# Patient Record
Sex: Female | Born: 1960 | Race: White | Hispanic: No | Smoking: Never smoker
Health system: Southern US, Community
[De-identification: ages and names within clinical notes are randomized; demographics above are authoritative.]

## PROBLEM LIST (undated history)

## (undated) DIAGNOSIS — E785 Hyperlipidemia, unspecified: Secondary | ICD-10-CM

## (undated) DIAGNOSIS — E1169 Type 2 diabetes mellitus with other specified complication: Secondary | ICD-10-CM

## (undated) DIAGNOSIS — I10 Essential (primary) hypertension: Secondary | ICD-10-CM

## (undated) DIAGNOSIS — G4733 Obstructive sleep apnea (adult) (pediatric): Secondary | ICD-10-CM

## (undated) DIAGNOSIS — F329 Major depressive disorder, single episode, unspecified: Secondary | ICD-10-CM

## (undated) DIAGNOSIS — I2510 Atherosclerotic heart disease of native coronary artery without angina pectoris: Secondary | ICD-10-CM

## (undated) DIAGNOSIS — F32A Depression, unspecified: Secondary | ICD-10-CM

## (undated) DIAGNOSIS — E669 Obesity, unspecified: Secondary | ICD-10-CM

## (undated) HISTORY — PX: PERCUTANEOUS CORONARY STENT INTERVENTION (PCI-S): SHX6016

---

## 2003-12-29 ENCOUNTER — Other Ambulatory Visit: Payer: Self-pay

## 2004-08-02 ENCOUNTER — Emergency Department: Payer: Self-pay | Admitting: Emergency Medicine

## 2004-08-05 ENCOUNTER — Emergency Department: Payer: Self-pay | Admitting: Emergency Medicine

## 2004-08-07 ENCOUNTER — Emergency Department: Payer: Self-pay | Admitting: Emergency Medicine

## 2005-02-24 ENCOUNTER — Ambulatory Visit: Payer: Self-pay | Admitting: Family Medicine

## 2005-04-28 ENCOUNTER — Ambulatory Visit: Payer: Self-pay | Admitting: Orthopaedic Surgery

## 2005-12-28 ENCOUNTER — Observation Stay: Payer: Self-pay | Admitting: Internal Medicine

## 2005-12-28 ENCOUNTER — Other Ambulatory Visit: Payer: Self-pay

## 2006-02-21 ENCOUNTER — Emergency Department: Payer: Self-pay | Admitting: Unknown Physician Specialty

## 2006-04-26 ENCOUNTER — Ambulatory Visit: Payer: Self-pay | Admitting: Gastroenterology

## 2006-05-02 ENCOUNTER — Ambulatory Visit: Payer: Self-pay | Admitting: Gastroenterology

## 2006-05-30 ENCOUNTER — Ambulatory Visit: Payer: Self-pay | Admitting: Gastroenterology

## 2006-08-05 ENCOUNTER — Emergency Department: Payer: Self-pay | Admitting: Emergency Medicine

## 2006-08-15 ENCOUNTER — Emergency Department: Payer: Self-pay | Admitting: Emergency Medicine

## 2006-08-15 ENCOUNTER — Other Ambulatory Visit: Payer: Self-pay

## 2006-08-30 ENCOUNTER — Ambulatory Visit: Payer: Self-pay | Admitting: Family Medicine

## 2006-09-20 ENCOUNTER — Encounter: Payer: Self-pay | Admitting: Family Medicine

## 2006-09-30 ENCOUNTER — Encounter: Payer: Self-pay | Admitting: Family Medicine

## 2006-10-31 ENCOUNTER — Encounter: Payer: Self-pay | Admitting: Family Medicine

## 2006-11-30 ENCOUNTER — Encounter: Payer: Self-pay | Admitting: Family Medicine

## 2007-09-12 ENCOUNTER — Other Ambulatory Visit: Payer: Self-pay

## 2007-09-13 ENCOUNTER — Ambulatory Visit: Payer: Self-pay | Admitting: Gastroenterology

## 2007-09-13 ENCOUNTER — Emergency Department: Payer: Self-pay | Admitting: Emergency Medicine

## 2007-12-18 ENCOUNTER — Ambulatory Visit: Payer: Self-pay | Admitting: Family Medicine

## 2007-12-31 ENCOUNTER — Ambulatory Visit: Payer: Self-pay | Admitting: Internal Medicine

## 2008-01-01 ENCOUNTER — Ambulatory Visit: Payer: Self-pay | Admitting: Unknown Physician Specialty

## 2008-01-04 ENCOUNTER — Ambulatory Visit: Payer: Self-pay | Admitting: Surgery

## 2008-01-11 ENCOUNTER — Ambulatory Visit: Payer: Self-pay | Admitting: Internal Medicine

## 2008-01-17 ENCOUNTER — Ambulatory Visit: Payer: Self-pay | Admitting: Surgery

## 2008-01-30 ENCOUNTER — Ambulatory Visit: Payer: Self-pay | Admitting: Internal Medicine

## 2009-10-01 ENCOUNTER — Emergency Department: Payer: Self-pay | Admitting: Internal Medicine

## 2009-11-09 ENCOUNTER — Observation Stay: Payer: Self-pay | Admitting: Student

## 2010-04-28 DIAGNOSIS — F32A Depression, unspecified: Secondary | ICD-10-CM | POA: Insufficient documentation

## 2010-04-28 DIAGNOSIS — F329 Major depressive disorder, single episode, unspecified: Secondary | ICD-10-CM | POA: Insufficient documentation

## 2011-01-17 DIAGNOSIS — E669 Obesity, unspecified: Secondary | ICD-10-CM | POA: Insufficient documentation

## 2011-01-17 DIAGNOSIS — I2510 Atherosclerotic heart disease of native coronary artery without angina pectoris: Secondary | ICD-10-CM | POA: Insufficient documentation

## 2011-04-07 IMAGING — US US CAROTID DUPLEX BILAT
1 series · 17 of 24 positions shown · non-contrast
Comparison: none

REASON FOR EXAM: right hand weakness, left hand parasthesias
COMMENTS:

[Series 1: us carotid duplex bilat · 17 of 53 slices shown]
[im 1/53]
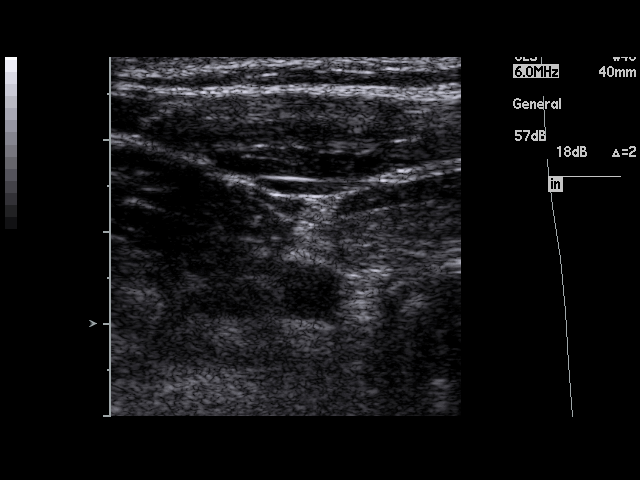
[im 5/53]
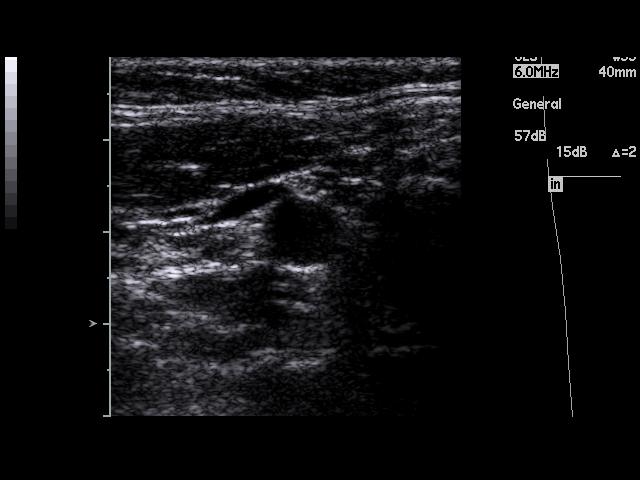
[im 7/53]
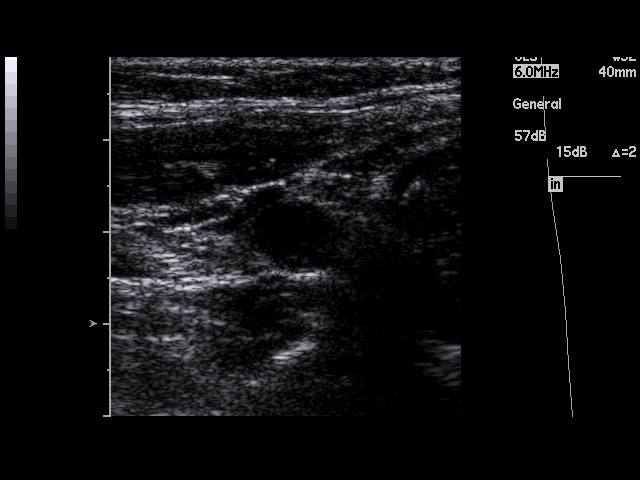
[im 10/53]
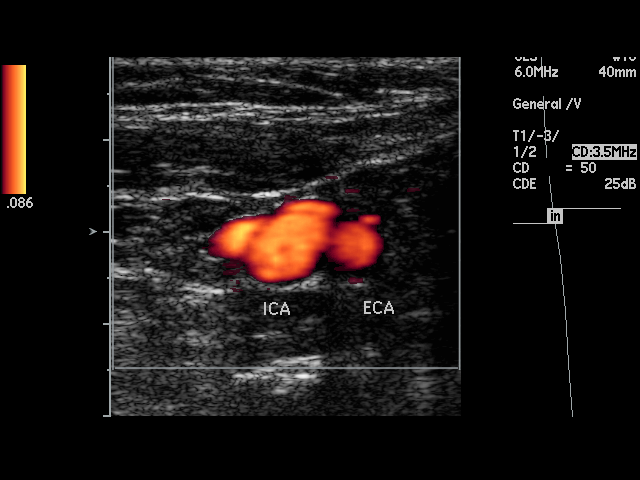
[im 14/53]
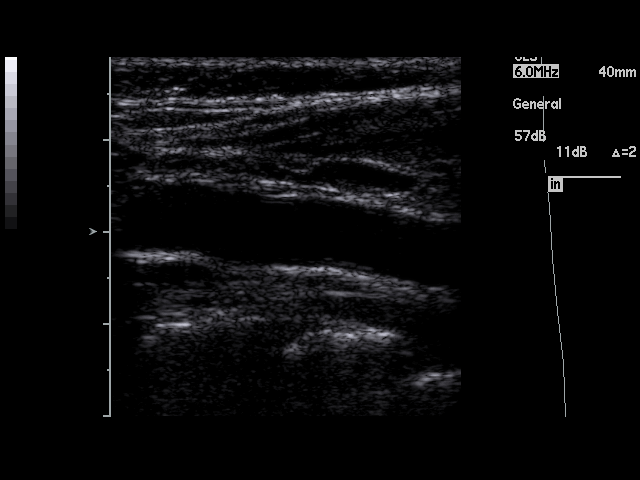
[im 16/53]
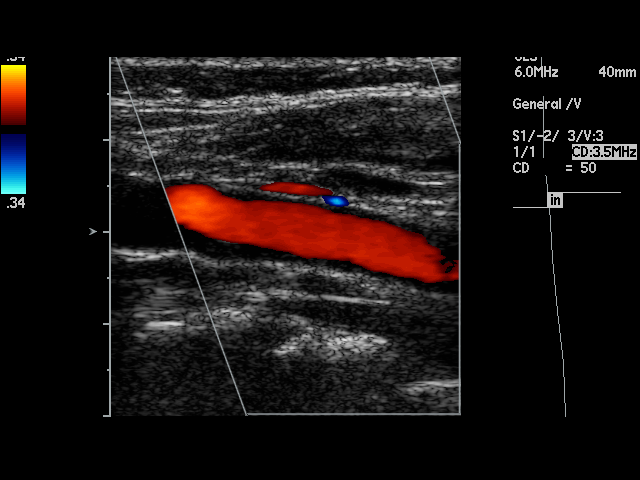
[im 21/53]
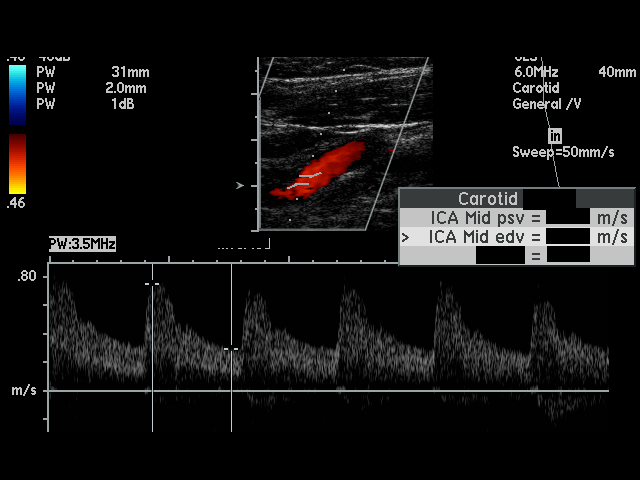
[im 23/53]
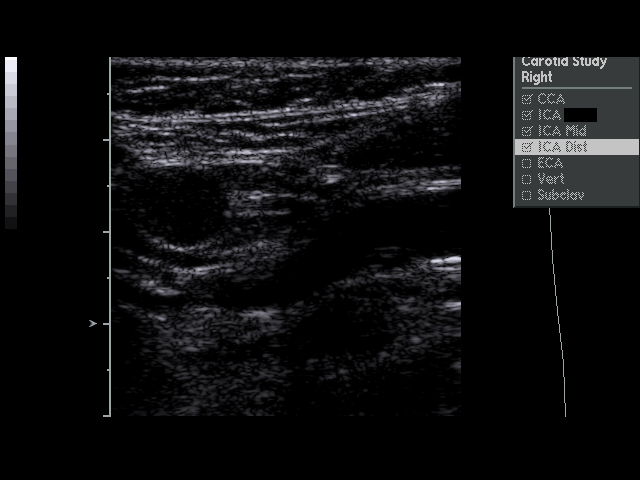
[im 28/53]
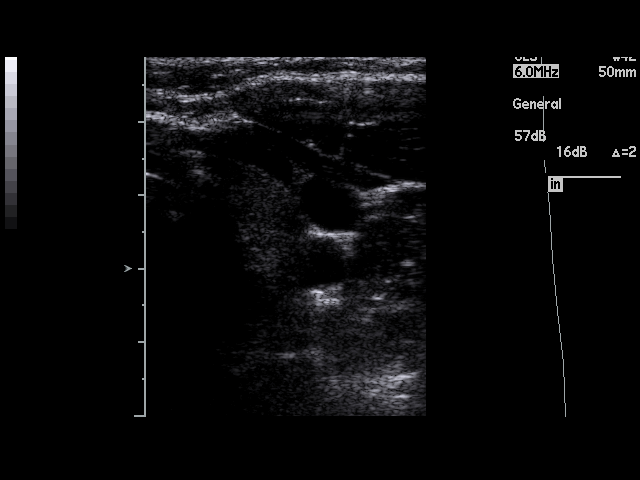
[im 30/53]
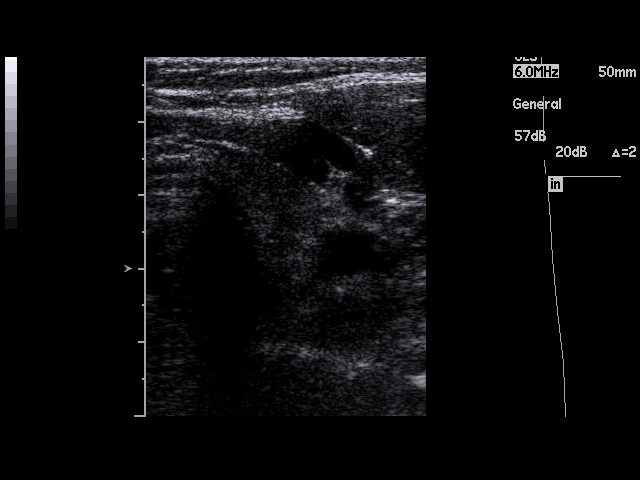
[im 32/53]
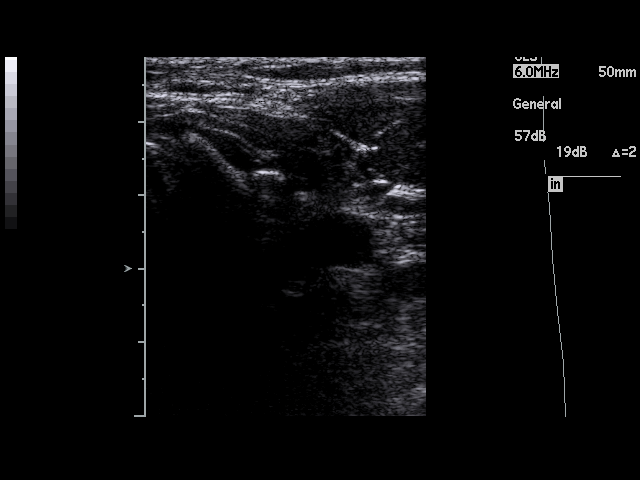
[im 37/53]
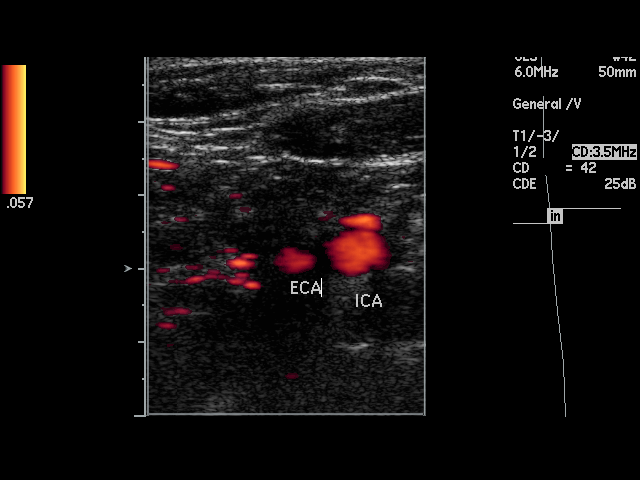
[im 39/53]
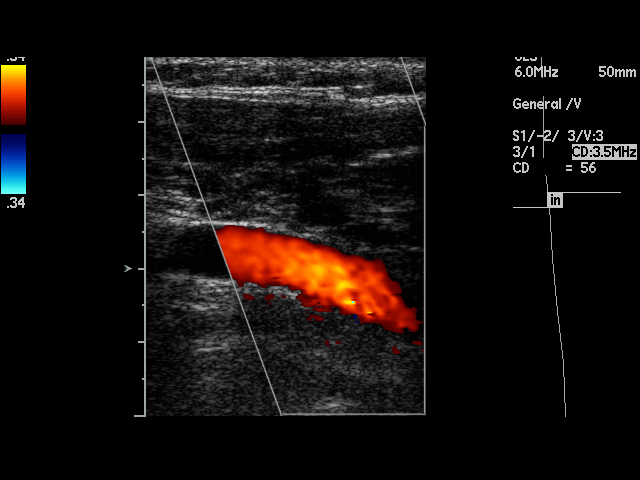
[im 43/53]
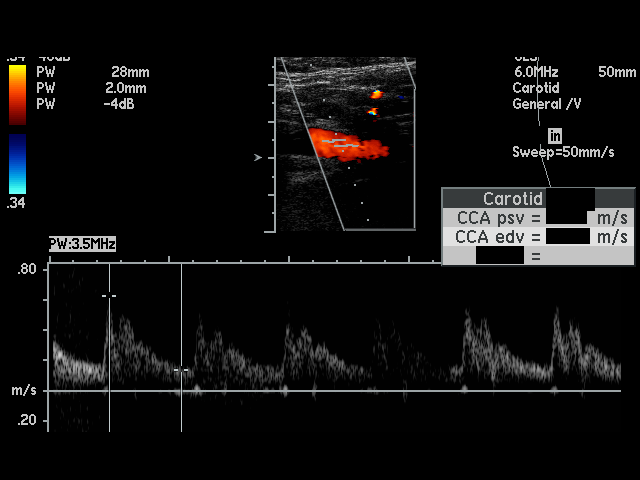
[im 46/53]
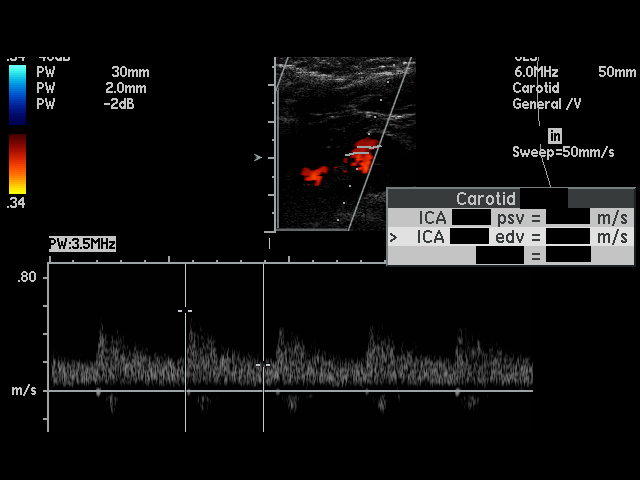
[im 48/53]
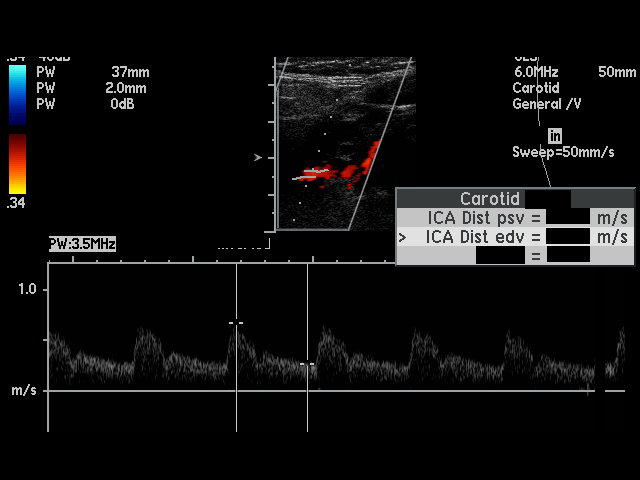
[im 53/53]
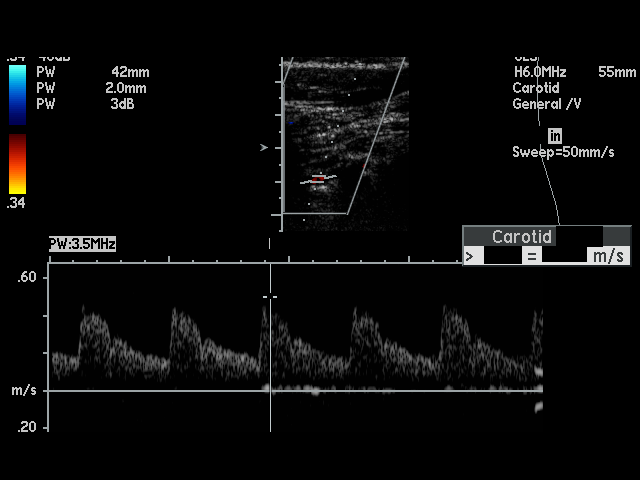

[17 of 24 positions shown; findings below may reference images not displayed]

PROCEDURE:     US  - US CAROTID DOPPLER BILATERAL  - November 10, 2009  [DATE]

RESULT:

Grayscale, Duplex, color flow and SPECTRAL waveform imaging was performed of
the right and left carotid systems.

Visual evaluation of the right and left carotid systems demonstrates minimal
intimal thickening asymmetric involving the internal carotid artery and
carotid bulb on the right as well as the internal carotid artery on the left.

SPECTRAL waveform and color flow imaging within the right and left carotid
systems is unremarkable. Antegrade flow is identified within the right and
left vertebral arteries. ICA/CCA ratios:

Right:
Left:
IMPRESSION: No sonographic evidence of hemodynamically significant
stenosis within the right or left carotid system.

## 2011-11-10 DIAGNOSIS — E119 Type 2 diabetes mellitus without complications: Secondary | ICD-10-CM | POA: Insufficient documentation

## 2012-12-25 ENCOUNTER — Emergency Department: Payer: Self-pay | Admitting: Emergency Medicine

## 2012-12-25 LAB — BASIC METABOLIC PANEL
Anion Gap: 5 — ABNORMAL LOW (ref 7–16)
BUN: 17 mg/dL (ref 7–18)
Creatinine: 1.23 mg/dL (ref 0.60–1.30)
EGFR (African American): 58 — ABNORMAL LOW
EGFR (Non-African Amer.): 50 — ABNORMAL LOW
Glucose: 65 mg/dL (ref 65–99)
Osmolality: 279 (ref 275–301)
Potassium: 4 mmol/L (ref 3.5–5.1)
Sodium: 140 mmol/L (ref 136–145)

## 2012-12-25 LAB — URINALYSIS, COMPLETE
Bilirubin,UR: NEGATIVE
Blood: NEGATIVE
Glucose,UR: NEGATIVE mg/dL (ref 0–75)
Nitrite: NEGATIVE
Ph: 5 (ref 4.5–8.0)
RBC,UR: 26 /HPF (ref 0–5)
Specific Gravity: 1.019 (ref 1.003–1.030)
Squamous Epithelial: 4
WBC UR: 666 /HPF (ref 0–5)

## 2012-12-25 LAB — CBC
HCT: 37.7 % (ref 35.0–47.0)
MCV: 85 fL (ref 80–100)
Platelet: 308 10*3/uL (ref 150–440)
RBC: 4.46 10*6/uL (ref 3.80–5.20)
RDW: 13.3 % (ref 11.5–14.5)

## 2012-12-27 LAB — URINE CULTURE

## 2013-01-24 ENCOUNTER — Inpatient Hospital Stay: Payer: Self-pay | Admitting: Internal Medicine

## 2013-01-24 LAB — COMPREHENSIVE METABOLIC PANEL
Albumin: 3.4 g/dL (ref 3.4–5.0)
Alkaline Phosphatase: 83 U/L (ref 50–136)
Anion Gap: 7 (ref 7–16)
BUN: 10 mg/dL (ref 7–18)
Calcium, Total: 9.1 mg/dL (ref 8.5–10.1)
Chloride: 100 mmol/L (ref 98–107)
Co2: 29 mmol/L (ref 21–32)
Creatinine: 1.25 mg/dL (ref 0.60–1.30)
EGFR (African American): 56 — ABNORMAL LOW
EGFR (Non-African Amer.): 49 — ABNORMAL LOW
Glucose: 120 mg/dL — ABNORMAL HIGH (ref 65–99)
Osmolality: 272 (ref 275–301)
Potassium: 3.5 mmol/L (ref 3.5–5.1)
SGOT(AST): 13 U/L — ABNORMAL LOW (ref 15–37)
Sodium: 136 mmol/L (ref 136–145)
Total Protein: 7.8 g/dL (ref 6.4–8.2)

## 2013-01-24 LAB — URINALYSIS, COMPLETE
Ketone: NEGATIVE
Leukocyte Esterase: NEGATIVE
Nitrite: NEGATIVE
Ph: 5 (ref 4.5–8.0)
RBC,UR: 1 /HPF (ref 0–5)
Specific Gravity: 1.018 (ref 1.003–1.030)

## 2013-01-24 LAB — CBC
MCH: 28.4 pg (ref 26.0–34.0)
MCV: 84 fL (ref 80–100)
Platelet: 245 10*3/uL (ref 150–440)
RBC: 4.41 10*6/uL (ref 3.80–5.20)
WBC: 21.1 10*3/uL — ABNORMAL HIGH (ref 3.6–11.0)

## 2013-01-25 LAB — BASIC METABOLIC PANEL
Anion Gap: 4 — ABNORMAL LOW (ref 7–16)
BUN: 13 mg/dL (ref 7–18)
Calcium, Total: 8.1 mg/dL — ABNORMAL LOW (ref 8.5–10.1)
Glucose: 146 mg/dL — ABNORMAL HIGH (ref 65–99)
Osmolality: 280 (ref 275–301)
Potassium: 3.9 mmol/L (ref 3.5–5.1)
Sodium: 139 mmol/L (ref 136–145)

## 2013-01-25 LAB — CBC WITH DIFFERENTIAL/PLATELET
Basophil %: 0.3 %
Lymphocyte %: 15.5 %
MCHC: 33.8 g/dL (ref 32.0–36.0)
MCV: 84 fL (ref 80–100)
Monocyte #: 2.1 x10 3/mm — ABNORMAL HIGH (ref 0.2–0.9)
Monocyte %: 11.9 %
Neutrophil #: 12.9 10*3/uL — ABNORMAL HIGH (ref 1.4–6.5)
RBC: 4.05 10*6/uL (ref 3.80–5.20)
RDW: 13.4 % (ref 11.5–14.5)
WBC: 18.2 10*3/uL — ABNORMAL HIGH (ref 3.6–11.0)

## 2013-01-30 LAB — CULTURE, BLOOD (SINGLE)

## 2013-05-10 DIAGNOSIS — I4719 Other supraventricular tachycardia: Secondary | ICD-10-CM | POA: Insufficient documentation

## 2013-05-10 DIAGNOSIS — I471 Supraventricular tachycardia: Secondary | ICD-10-CM | POA: Insufficient documentation

## 2014-01-03 DIAGNOSIS — M1710 Unilateral primary osteoarthritis, unspecified knee: Secondary | ICD-10-CM | POA: Insufficient documentation

## 2014-01-03 DIAGNOSIS — M179 Osteoarthritis of knee, unspecified: Secondary | ICD-10-CM | POA: Insufficient documentation

## 2014-06-13 DIAGNOSIS — M720 Palmar fascial fibromatosis [Dupuytren]: Secondary | ICD-10-CM | POA: Insufficient documentation

## 2014-06-21 IMAGING — CR DG CHEST 1V PORT
1 series · 1 of 1 positions shown · non-contrast
Comparison: none

REASON FOR EXAM: sepsis
COMMENTS:

[ap]
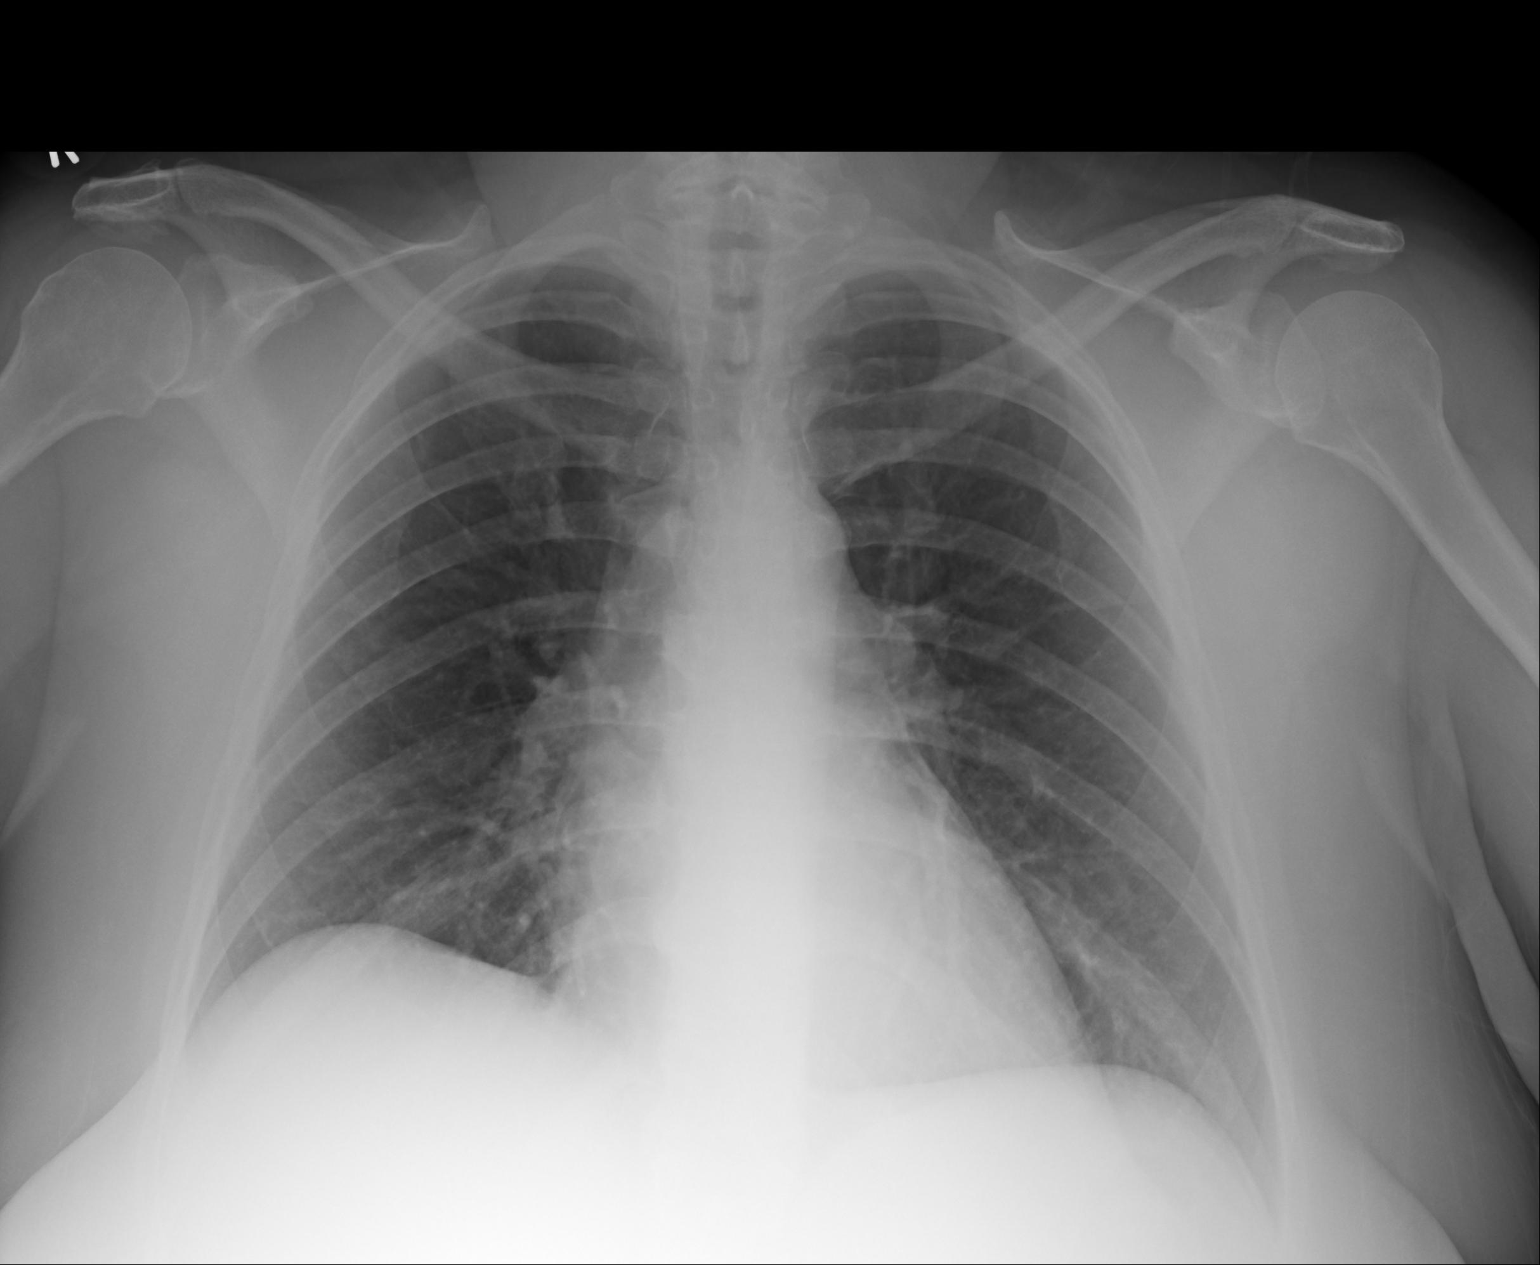

[1 of 1 positions shown; findings below may reference images not displayed]

PROCEDURE:     DXR - DXR PORTABLE CHEST SINGLE VIEW  - January 24, 2013  [DATE]

RESULT:     Comparison is made to the study of 11/09/2009.

The lungs are clear. The heart and pulmonary vessels are normal. The bony
and mediastinal structures are unremarkable. There is no effusion. There is
no pneumothorax or evidence of congestive failure.
IMPRESSION: No acute cardiopulmonary disease.

[REDACTED]

## 2014-11-21 NOTE — H&P (Signed)
PATIENT NAME:  [name], [name] MR#:  [id number] DATE OF BIRTH:  [birth year]  DATE OF ADMISSION:  01/24/2013  PRIMARY CARE PHYSICIAN: At [facility].   CHIEF COMPLAINT: Abdominal wall redness, pain.   HISTORY OF PRESENTING ILLNESS: This 54 year old female patient with history of hypertension, diabetes, coronary artery disease presents to the Emergency Room complaining of 2 days of redness and pain on her left side of the abdominal wall. Here, the patient has been found to have cellulitis. Aspiration was attempted of the site as there was a small blister but nothing could be aspirated. No fluctuance. The patient is febrile at 100.2, tachycardic into the 110s, elevated white count of 20,000 and is being admitted to the hospitalist service for sepsis with abdominal wall cellulitis. The patient has been on vancomycin in the Emergency Room.   She does not have any nausea, vomiting, dysuria, shortness of breath. After the patient received a dose of vancomycin, the patient has been a little restless. Family members mention she has been more sleepy today, poor appetite. Multiple family members, including kids, in the house have MRSA infections recurrently.   PAST MEDICAL HISTORY:  1. TIA.   2. Hypertension.  3. Diabetes mellitus, type 2.  4. Hyperlipidemia.  5. Anxiety.  6. Knee surgery bilaterally.  7. Depression.  8. Mitral valve prolapse.  9. Obesity.  10. Obstructive sleep apnea, on CPAP at night.  11. Migraines.  12. Coronary artery disease with stent placement.   SOCIAL HISTORY: Does not smoke. No alcohol. No illicit drugs. She is married with children and grandkids.    FAMILY HISTORY: Multiple family members with heart disease, diabetes, hypertension. Mother apparently had lung cancer.   REVIEW OF SYSTEMS:  CONSTITUTIONAL: Has fever. No weight loss, weight gain.  EYES: No blurred vision, pain, redness.  ENT: No tinnitus, ear pain, hearing loss.  RESPIRATORY: No shortness of breath,  cough, hemoptysis.  CARDIOVASCULAR: No chest pain, PND, orthopnea, edema,  GASTROINTESTINAL: No nausea, vomiting, diarrhea.  GENITOURINARY: No dysuria, hematuria.  MUSCULOSKELETAL: No joint pain, back pain or arthritis.  SKIN: Has the abdominal wall cellulitis.  PSYCHIATRIC: No anxiety or depression.  NEUROLOGIC: No focal numbness, weakness.   HOME MEDICATIONS: Incomplete list available at this time and doses unavailable on a few of the medications, but the patient at home is on aspirin, atorvastatin, Effient, gabapentin, glyburide, Humulin N, lisinopril, metformin, metoprolol, multivitamin, Zofran, paroxetine, trazodone and Wellbutrin.   PHYSICAL EXAMINATION:  VITAL SIGNS: Temperature 100.8, pulse of 118, respirations 18, blood pressure 109/60, saturating 97% on 1 liter oxygen.  GENERAL: Obese Caucasian female patient lying in bed, restless.  PSYCHIATRIC: Alert, awake. Mood and affect appropriate. Judgment intact.  HEENT: Atraumatic, normocephalic. Oral mucosa moist and pink. External ears and nose normal. No pallor. No icterus. Pupils bilaterally equal and reactive to light.  NECK: Supple. No thyromegaly. No palpable lymph nodes. Trachea midline. No carotid bruit, JVD.  CARDIOVASCULAR: S1, S2, tachycardic without any murmurs. No edema in the lower extremities.  RESPIRATORY: Normal work of breathing. Clear to auscultation on both sides. GASTROINTESTINAL: Soft abdomen, nontender. Bowel sounds present. No hepatosplenomegaly palpable.  SKIN: Warm and dry. Has the abdominal wall cellulitis area in the left lateral in the skin fold measuring about 5 x 5 cm. A small blister where aspiration was attempted. No fluctuance.  MUSCULOSKELETAL: No joint swelling, redness, effusion of the large joints. Normal muscle tone.  NEUROLOGICAL: Moves all 4 extremities equally on both sides.  LYMPHATIC: No cervical, supraclavicular or inguinal  lymphadenopathy.  GENITOURINARY: No CVA tenderness or bladder  distention.   LAB STUDIES: Show blood glucose 781, BUN 10, creatinine 1.25, sodium 136, potassium 3.5. Albumin 3.4. AST, ALT, alkaline phosphatase, bilirubin normal. WBC 21.1, hemoglobin 12.5, platelets of 245. Urinalysis shows trace bacteria but only 2 WBCs.   ASSESSMENT AND PLAN:  1. Abdominal wall cellulitis with sepsis. The patient has tachycardia, elevated white count and fever of 100.8. Will start the patient on intravenous vancomycin and intravenous fluids. Send for blood cultures. Skin has been marked to follow on the progress of cellulitis. No fluctuance. No abscess. Aspiration was attempted in the ER and nothing could be aspirated. Multiple family members with history of methicillin-resistant Staphylococcus aureus. The patient is on vancomycin.  2. Sepsis secondary to cellulitis.  3. Diabetes mellitus. Continue home medications. Secondary to poor appetite, will drop her insulin dosage to half. Can up titrate depending on the blood sugars.  4. Hypertension. Continue home medications. Dosages not available on a few of the medications. Have to review medications again when the complete list is available.  5. Coronary artery disease, stable.  6. Sleep apnea. Will continue CPAP in the hospital at night.  7. Deep vein thrombosis prophylaxis with Lovenox.   CODE STATUS: FULL CODE.   Time spent today on this case was 45 minutes.   ____________________________ Leia Alf Nolan Lasser, MD srs:gb D: 01/24/2013 22:37:31 ET T: 01/24/2013 22:49:03 ET JOB#: [id number]  cc: Alveta Heimlich R. Alexiz Cothran, MD, <Dictator> [place] MD ELECTRONICALLY SIGNED 01/25/2013 15:52

## 2014-11-21 NOTE — Discharge Summary (Signed)
PATIENT NAMEBOBBYJO, [name] MR#:  [id number] DATE OF BIRTH:  1961-05-04  DATE OF ADMISSION:  01/24/2013 DATE OF DISCHARGE:  01/25/2013  PRIMARY CARE PHYSICIAN:  None local.  CONDITION: Stable.   CODE STATUS: Full code.   HOME MEDICATIONS: Please refer to the [facility] physician discharge instruction medication reconciliation list. Continue all home medications.  We will add clindamycin 300 mg capsule 2 caps every 6 hours for 7 days.   DIET: Low sodium, low fat, low cholesterol ADA diet.   ACTIVITY: As tolerated.   FOLLOW UP CARE: Follow with PCP within one week. Follow up CBC.   DISCHARGE DIAGNOSES: 1. Abdominal wall cellulitis with sepsis, leukocytosis.  2. Hypertension.  3. Diabetes.  4. Obesity.   REASON FOR ADMISSION: Abdominal wall redness and pain.   HOSPITAL COURSE: The patient is a 54 year old Caucasian female with a history of hypertension, diabetes, coronary artery disease, presented to the ED with 2 days of abdominal wall redness and pain. The patient was found to have cellulitis and was treated with vancomycin. Patient's white count was at 20,000 in ED.  For detailed history and physical examination, please refer to the admission note dictated by Dr. Darvin Neighbours.  After admission, the patient has been treated with vancomycin IVPB, the patient's WBC decreased to 18.2 today. The patient's symptoms have much improved. She has no abdominal wall pain, erythema or tenderness. She is clinically stable and will be discharged to home today. I discussed the patient's discharge plan with the patient and the patient's mother. The patient was advised to follow up with PCP and get CBC and that we will start clindamycin for 7 days.   TIME SPENT: About 35 minutes.   ____________________________ Demetrios Loll, MD qc:rw D: 01/25/2013 16:44:18 ET T: 01/25/2013 18:42:02 ET JOB#: [id number]  cc: Demetrios Loll, MD, <Dictator> Demetrios Loll MD ELECTRONICALLY SIGNED 01/25/2013 23:38

## 2014-12-12 DIAGNOSIS — G4733 Obstructive sleep apnea (adult) (pediatric): Secondary | ICD-10-CM | POA: Insufficient documentation

## 2015-04-07 ENCOUNTER — Other Ambulatory Visit: Payer: Self-pay | Admitting: Student in an Organized Health Care Education/Training Program

## 2015-05-10 ENCOUNTER — Other Ambulatory Visit: Payer: Self-pay | Admitting: Student in an Organized Health Care Education/Training Program

## 2015-05-21 ENCOUNTER — Observation Stay
Admission: EM | Admit: 2015-05-21 | Discharge: 2015-05-22 | Disposition: A | Discharge status: Home / Self Care | Payer: Medicare HMO | Attending: Internal Medicine | Admitting: Internal Medicine

## 2015-05-21 ENCOUNTER — Encounter: Payer: Self-pay | Admitting: Emergency Medicine

## 2015-05-21 ENCOUNTER — Emergency Department: Payer: Medicare HMO

## 2015-05-21 DIAGNOSIS — Z7984 Long term (current) use of oral hypoglycemic drugs: Secondary | ICD-10-CM | POA: Diagnosis not present

## 2015-05-21 DIAGNOSIS — I471 Supraventricular tachycardia: Secondary | ICD-10-CM | POA: Diagnosis not present

## 2015-05-21 DIAGNOSIS — F419 Anxiety disorder, unspecified: Secondary | ICD-10-CM | POA: Diagnosis not present

## 2015-05-21 DIAGNOSIS — E119 Type 2 diabetes mellitus without complications: Secondary | ICD-10-CM | POA: Diagnosis not present

## 2015-05-21 DIAGNOSIS — Z794 Long term (current) use of insulin: Secondary | ICD-10-CM | POA: Insufficient documentation

## 2015-05-21 DIAGNOSIS — F329 Major depressive disorder, single episode, unspecified: Secondary | ICD-10-CM | POA: Diagnosis not present

## 2015-05-21 DIAGNOSIS — I2510 Atherosclerotic heart disease of native coronary artery without angina pectoris: Secondary | ICD-10-CM | POA: Insufficient documentation

## 2015-05-21 DIAGNOSIS — R110 Nausea: Secondary | ICD-10-CM | POA: Insufficient documentation

## 2015-05-21 DIAGNOSIS — E669 Obesity, unspecified: Secondary | ICD-10-CM | POA: Diagnosis not present

## 2015-05-21 DIAGNOSIS — Z79899 Other long term (current) drug therapy: Secondary | ICD-10-CM | POA: Diagnosis not present

## 2015-05-21 DIAGNOSIS — Z955 Presence of coronary angioplasty implant and graft: Secondary | ICD-10-CM | POA: Insufficient documentation

## 2015-05-21 DIAGNOSIS — G4733 Obstructive sleep apnea (adult) (pediatric): Secondary | ICD-10-CM | POA: Insufficient documentation

## 2015-05-21 DIAGNOSIS — R079 Chest pain, unspecified: Secondary | ICD-10-CM | POA: Diagnosis not present

## 2015-05-21 DIAGNOSIS — E785 Hyperlipidemia, unspecified: Secondary | ICD-10-CM | POA: Insufficient documentation

## 2015-05-21 DIAGNOSIS — Z6836 Body mass index (BMI) 36.0-36.9, adult: Secondary | ICD-10-CM | POA: Diagnosis not present

## 2015-05-21 DIAGNOSIS — I10 Essential (primary) hypertension: Secondary | ICD-10-CM | POA: Insufficient documentation

## 2015-05-21 DIAGNOSIS — Z9049 Acquired absence of other specified parts of digestive tract: Secondary | ICD-10-CM | POA: Insufficient documentation

## 2015-05-21 HISTORY — DX: Major depressive disorder, single episode, unspecified: F32.9

## 2015-05-21 HISTORY — DX: Type 2 diabetes mellitus with other specified complication: E11.69

## 2015-05-21 HISTORY — DX: Obesity, unspecified: E66.9

## 2015-05-21 HISTORY — DX: Depression, unspecified: F32.A

## 2015-05-21 HISTORY — DX: Essential (primary) hypertension: I10

## 2015-05-21 HISTORY — DX: Atherosclerotic heart disease of native coronary artery without angina pectoris: I25.10

## 2015-05-21 HISTORY — DX: Hyperlipidemia, unspecified: E78.5

## 2015-05-21 HISTORY — DX: Obstructive sleep apnea (adult) (pediatric): G47.33

## 2015-05-21 LAB — TROPONIN I

## 2015-05-21 LAB — CBC
HCT: 39.1 % (ref 35.0–47.0)
HEMOGLOBIN: 12.8 g/dL (ref 12.0–16.0)
MCH: 28.6 pg (ref 26.0–34.0)
MCHC: 32.9 g/dL (ref 32.0–36.0)
MCV: 86.9 fL (ref 80.0–100.0)
PLATELETS: 291 10*3/uL (ref 150–440)
RBC: 4.5 MIL/uL (ref 3.80–5.20)
RDW: 12.9 % (ref 11.5–14.5)
WBC: 12.4 10*3/uL — ABNORMAL HIGH (ref 3.6–11.0)

## 2015-05-21 LAB — COMPREHENSIVE METABOLIC PANEL
ALT: 25 U/L (ref 14–54)
AST: 23 U/L (ref 15–41)
Albumin: 3.7 g/dL (ref 3.5–5.0)
Alkaline Phosphatase: 62 U/L (ref 38–126)
Anion gap: 7 (ref 5–15)
BUN: 20 mg/dL (ref 6–20)
CHLORIDE: 102 mmol/L (ref 101–111)
CO2: 29 mmol/L (ref 22–32)
CREATININE: 1.52 mg/dL — AB (ref 0.44–1.00)
Calcium: 9.3 mg/dL (ref 8.9–10.3)
GFR calc non Af Amer: 37 mL/min — ABNORMAL LOW (ref >60)
GFR, EST AFRICAN AMERICAN: 43 mL/min — AB (ref >60)
Glucose, Bld: 100 mg/dL — ABNORMAL HIGH (ref 65–99)
POTASSIUM: 4.2 mmol/L (ref 3.5–5.1)
SODIUM: 138 mmol/L (ref 135–145)
Total Bilirubin: 0.3 mg/dL (ref 0.3–1.2)
Total Protein: 7.2 g/dL (ref 6.5–8.1)

## 2015-05-21 NOTE — ED Provider Notes (Signed)
[facility] Emergency Department Provider Note  Time seen: 11:27 PM  I have reviewed the triage vital signs and the nursing notes.   HISTORY  Chief Complaint Chest Pain    HPI [name] is a 54 y.o. female with a past medical history of hypertension, hyperlipidemia, diabetes, CAD status post stent, and second angioplasty, presents the emergency department for chest pain. According to the patient at 8 PM tonight she developed acute mid to left chest pain. Describes as a dull aching pressure 8/10 in severity radiating to her back and up to her shoulder. Associated with flushing/diaphoresis, and nausea. Took 2 nitroglycerin and aspirin with improvement from an 8/10 to a 5/10. Continues with chest pain in the emergency department. Patient states the last time she needed nitroglycerin was 5 months ago, and she only has ever needed 1 tablet.     History reviewed. No pertinent past medical history.  There are no active problems to display for this patient.   History reviewed. No pertinent past surgical history.  No current outpatient prescriptions on file.  Allergies Review of patient's allergies indicates no known allergies.  History reviewed. No pertinent family history.  Social History Social History  Substance Use Topics  . Smoking status: Never Smoker   . Smokeless tobacco: None  . Alcohol Use: None    Review of Systems Constitutional: Negative for fever. Cardiovascular: Positive for chest pain Respiratory: Negative for shortness of breath. Gastrointestinal: Negative for abdominal pain. Positive for nausea Genitourinary: Negative for dysuria. Musculoskeletal: Pain radiates to the back 10-point ROS otherwise negative.  ____________________________________________   PHYSICAL EXAM:  VITAL SIGNS: ED Triage Vitals  Enc Vitals Group     BP 05/21/15 2220 116/92 mmHg     Pulse Rate 05/21/15 2222 69     Resp 05/21/15 2220 18     Temp  05/21/15 2222 98.4 F (36.9 C)     Temp Source 05/21/15 2222 Oral     SpO2 05/21/15 2222 98 %     Weight 05/21/15 2220 215 lb (97.523 kg)     Height 05/21/15 2220 5\' 5"  (1.651 m)     Head Cir --      Peak Flow --      Pain Score 05/21/15 2221 7     Pain Loc --      Pain Edu? --      Excl. in [place]? --     Constitutional: Alert and oriented. Well appearing and in no distress. Eyes: Normal exam ENT   Head: Normocephalic and atraumatic   Mouth/Throat: Mucous membranes are moist. Cardiovascular: Normal rate, regular rhythm. No murmur. Equal pulses in bilateral upper extremities. Respiratory: Normal respiratory effort without tachypnea nor retractions. Breath sounds are clear and equal bilaterally. No wheezes/rales/rhonchi. Gastrointestinal: Soft and nontender. No distention.  Musculoskeletal: Nontender with normal range of motion in all extremities. No lower extremity tenderness or edema. Neurologic:  Normal speech and language. No gross focal neurologic deficits  Skin:  Skin is warm, dry and intact.  Psychiatric: Mood and affect are normal. ____________________________________________    EKG  EKG reviewed and interpreted by myself shows sinus rhythm at 69 bpm, narrow QRS, normal axis, normal intervals, nonspecific but no concerning ST changes noted.  ____________________________________________    RADIOLOGY  Chest x-ray shows no acute abnormalities  ____________________________________________    INITIAL IMPRESSION / ASSESSMENT AND PLAN / ED COURSE  Pertinent labs & imaging results that were available during my care of the patient were reviewed  by me and considered in my medical decision making (see chart for details).  Labs are within normal limits, however given the patient's history, risk factors, and concerning symptoms we will admit the patient for further workup and evaluation.  ____________________________________________   FINAL CLINICAL IMPRESSION(S) /  ED DIAGNOSES  Chest pain   Harvest Dark, MD 05/21/15 2330

## 2015-05-21 NOTE — ED Notes (Signed)
MD at bedside. 

## 2015-05-21 NOTE — ED Notes (Signed)
Pt arrives via EMS from home with c/o Midsternal/left chest pain that is pressure and 7/10.  Pain radiates through to back.  Pt with some nausea.  Pt rec'd 3 SL NTG with no relief and 324 mg ASA.

## 2015-05-22 ENCOUNTER — Encounter: Payer: Self-pay | Admitting: Internal Medicine

## 2015-05-22 DIAGNOSIS — R079 Chest pain, unspecified: Secondary | ICD-10-CM | POA: Diagnosis present

## 2015-05-22 DIAGNOSIS — R0789 Other chest pain: Secondary | ICD-10-CM | POA: Diagnosis not present

## 2015-05-22 LAB — GLUCOSE, CAPILLARY
Glucose-Capillary: 115 mg/dL — ABNORMAL HIGH (ref 65–99)
Glucose-Capillary: 153 mg/dL — ABNORMAL HIGH (ref 65–99)

## 2015-05-22 LAB — TSH: TSH: 5.597 u[IU]/mL — ABNORMAL HIGH (ref 0.350–4.500)

## 2015-05-22 LAB — HEMOGLOBIN A1C: Hgb A1c MFr Bld: 7.3 % — ABNORMAL HIGH (ref 4.0–6.0)

## 2015-05-22 LAB — TROPONIN I
Troponin I: <0.03 ng/mL (ref <0.031)
Troponin I: <0.03 ng/mL (ref <0.031)

## 2015-05-22 MED ORDER — SODIUM CHLORIDE 0.9 % IV SOLN
INTRAVENOUS | Status: DC
  Administered 2015-05-22: 01:00:00 via INTRAVENOUS

## 2015-05-22 MED ORDER — INFLUENZA VAC SPLIT QUAD 0.5 ML IM SUSY: 0.5000 mL | PREFILLED_SYRINGE | INTRAMUSCULAR | Status: DC

## 2015-05-22 MED ORDER — GABAPENTIN 300 MG PO CAPS
900.0000 mg | ORAL_CAPSULE | Freq: Every day | ORAL | Status: DC
  Administered 2015-05-22: 900 mg via ORAL
  Filled 2015-05-22: qty 3

## 2015-05-22 MED ORDER — ONDANSETRON HCL 4 MG/2ML IJ SOLN
4.0000 mg | Freq: Once | INTRAMUSCULAR | Status: AC
  Administered 2015-05-22: 4 mg via INTRAVENOUS
  Filled 2015-05-22: qty 2

## 2015-05-22 MED ORDER — INSULIN DETEMIR 100 UNIT/ML ~~LOC~~ SOLN
20.0000 [IU] | Freq: Every day | SUBCUTANEOUS | Status: DC
  Filled 2015-05-22 (×2): qty 0.2

## 2015-05-22 MED ORDER — FUROSEMIDE 20 MG PO TABS
20.0000 mg | ORAL_TABLET | Freq: Every day | ORAL | Status: DC
  Filled 2015-05-22: qty 1

## 2015-05-22 MED ORDER — METOPROLOL TARTRATE 25 MG PO TABS
25.0000 mg | ORAL_TABLET | Freq: Every morning | ORAL | Status: DC
  Filled 2015-05-22: qty 1

## 2015-05-22 MED ORDER — HYDROXYZINE HCL 25 MG PO TABS: 25.0000 mg | ORAL_TABLET | ORAL | Status: DC | PRN

## 2015-05-22 MED ORDER — ISOSORBIDE MONONITRATE ER 30 MG PO TB24
30.0000 mg | ORAL_TABLET | Freq: Every day | ORAL | Status: DC
  Administered 2015-05-22: 30 mg via ORAL
  Filled 2015-05-22: qty 1

## 2015-05-22 MED ORDER — PRASUGREL HCL 10 MG PO TABS
10.0000 mg | ORAL_TABLET | Freq: Every day | ORAL | Status: DC
  Administered 2015-05-22: 10 mg via ORAL
  Filled 2015-05-22: qty 1

## 2015-05-22 MED ORDER — ACETAMINOPHEN 650 MG RE SUPP: 650.0000 mg | Freq: Four times a day (QID) | RECTAL | Status: DC | PRN

## 2015-05-22 MED ORDER — ACETAMINOPHEN 325 MG PO TABS: 650.0000 mg | ORAL_TABLET | Freq: Four times a day (QID) | ORAL | Status: DC | PRN

## 2015-05-22 MED ORDER — TRAZODONE HCL 50 MG PO TABS
75.0000 mg | ORAL_TABLET | Freq: Every day | ORAL | Status: DC
  Administered 2015-05-22: 75 mg via ORAL
  Filled 2015-05-22: qty 2

## 2015-05-22 MED ORDER — BUPROPION HCL 75 MG PO TABS
75.0000 mg | ORAL_TABLET | Freq: Two times a day (BID) | ORAL | Status: DC
  Administered 2015-05-22 (×2): 75 mg via ORAL
  Filled 2015-05-22 (×3): qty 1

## 2015-05-22 MED ORDER — SODIUM CHLORIDE 0.9 % IJ SOLN
3.0000 mL | Freq: Two times a day (BID) | INTRAMUSCULAR | Status: DC
  Administered 2015-05-22: 3 mL via INTRAVENOUS

## 2015-05-22 MED ORDER — MORPHINE SULFATE (PF) 2 MG/ML IV SOLN: 2.0000 mg | INTRAVENOUS | Status: DC | PRN

## 2015-05-22 MED ORDER — INSULIN ASPART 100 UNIT/ML ~~LOC~~ SOLN: 0.0000 [IU] | Freq: Three times a day (TID) | SUBCUTANEOUS | Status: DC

## 2015-05-22 MED ORDER — HEPARIN SODIUM (PORCINE) 5000 UNIT/ML IJ SOLN
5000.0000 [IU] | Freq: Three times a day (TID) | INTRAMUSCULAR | Status: DC
  Administered 2015-05-22 (×2): 5000 [IU] via SUBCUTANEOUS
  Filled 2015-05-22 (×2): qty 1

## 2015-05-22 MED ORDER — CITALOPRAM HYDROBROMIDE 20 MG PO TABS
40.0000 mg | ORAL_TABLET | Freq: Every day | ORAL | Status: DC
  Administered 2015-05-22: 40 mg via ORAL
  Filled 2015-05-22: qty 2

## 2015-05-22 MED ORDER — DOCUSATE SODIUM 100 MG PO CAPS
100.0000 mg | ORAL_CAPSULE | Freq: Two times a day (BID) | ORAL | Status: DC
  Administered 2015-05-22 (×2): 100 mg via ORAL
  Filled 2015-05-22 (×2): qty 1

## 2015-05-22 MED ORDER — ONDANSETRON HCL 4 MG PO TABS: 4.0000 mg | ORAL_TABLET | Freq: Four times a day (QID) | ORAL | Status: DC | PRN

## 2015-05-22 MED ORDER — ONDANSETRON HCL 4 MG/2ML IJ SOLN: 4.0000 mg | Freq: Four times a day (QID) | INTRAMUSCULAR | Status: DC | PRN

## 2015-05-22 MED ORDER — ATORVASTATIN CALCIUM 20 MG PO TABS
80.0000 mg | ORAL_TABLET | Freq: Every day | ORAL | Status: DC
  Administered 2015-05-22: 80 mg via ORAL
  Filled 2015-05-22: qty 4

## 2015-05-22 MED ORDER — LISINOPRIL 5 MG PO TABS
2.5000 mg | ORAL_TABLET | Freq: Every day | ORAL | Status: DC
  Administered 2015-05-22: 2.5 mg via ORAL
  Filled 2015-05-22: qty 1

## 2015-05-22 MED ORDER — METOPROLOL TARTRATE 25 MG PO TABS
12.5000 mg | ORAL_TABLET | Freq: Every evening | ORAL | Status: DC
  Administered 2015-05-22: 12.5 mg via ORAL
  Filled 2015-05-22: qty 0.5

## 2015-05-22 MED ORDER — MORPHINE SULFATE (PF) 4 MG/ML IV SOLN
4.0000 mg | Freq: Once | INTRAVENOUS | Status: AC
  Administered 2015-05-22: 4 mg via INTRAVENOUS
  Filled 2015-05-22: qty 1

## 2015-05-22 NOTE — Consult Note (Signed)
CARDIOLOGY CONSULT NOTE  Patient ID: [name] MRN: [id number] DOB/AGE: [birth year] 54 y.o.  Admit date: 05/21/2015  Primary Cardiologist Dr. Loraine Leriche at [facility] Reason for Consultation chest pain.  HPI: This is a 54 year old female with known history of coronary artery disease followed at [facility]. She has known history of stable angina with documented coronary artery disease status post drug-eluting stent placement to the LAD in 2012 and angioplasty to the first diagonal in 2014, type 2 diabetes, obstructive sleep apnea, hyperlipidemia, paroxysmal atrial tachycardia, depression and anxiety. Most recent nuclear stress test September 2015 was normal. She presented with chest pain that happened yesterday in the afternoon. It was described as aching and pressure feeling which lasted for a few hours. He did not respond to nitroglycerin. She is currently chest pain-free. Her troponin has been negative and ECG did not show any acute changes.  Review of systems complete and found to be negative unless listed above   Past Medical History  Diagnosis Date  . Coronary artery disease   . Hypertension   . Diabetes mellitus type 2 in obese ([place])   . OSA (obstructive sleep apnea)   . Hyperlipidemia   . Depression     Family History  Problem Relation Age of Onset  . Coronary artery disease Father   . Diabetes Mellitus II Mother   . Lung cancer Mother   . Cancer - Other Father     Mouth    Social History   Social History  . Marital Status: Widowed    Spouse Name: N/A  . Number of Children: N/A  . Years of Education: N/A   Occupational History  . Not on file.   Social History Main Topics  . Smoking status: Never Smoker   . Smokeless tobacco: Not on file  . Alcohol Use: Not on file  . Drug Use: Not on file  . Sexual Activity: Not on file   Other Topics Concern  . Not on file   Social History Narrative    Past Surgical History  Procedure Laterality Date  . Percutaneous coronary  stent intervention (pci-s)      x2     Prescriptions prior to admission  Medication Sig Dispense Refill Last Dose  . atorvastatin (LIPITOR) 80 MG tablet Take 80 mg by mouth at bedtime.   05/20/2015 at Unknown time  . buPROPion (WELLBUTRIN) 75 MG tablet Take 75 mg by mouth 2 (two) times daily.   05/21/2015 at Unknown time  . citalopram (CELEXA) 40 MG tablet Take 1 tablet by mouth daily.   05/21/2015 at Unknown time  . furosemide (LASIX) 20 MG tablet Take 1 tablet by mouth daily.   05/21/2015 at Unknown time  . gabapentin (NEURONTIN) 300 MG capsule Take 3 capsules by mouth at bedtime.   05/20/2015 at Unknown time  . glipiZIDE (GLUCOTROL) 10 MG tablet Take 1 tablet by mouth 2 (two) times daily.   05/21/2015 at Unknown time  . hydrOXYzine (ATARAX/VISTARIL) 25 MG tablet Take 25 mg by mouth every 4 (four) hours as needed for anxiety.   prn at prn  . insulin NPH Human (HUMULIN N,NOVOLIN N) 100 UNIT/ML injection Inject 34-38 Units into the skin 2 (two) times daily before a meal. 38 units in the morning and 34 units in the evening   05/21/2015 at Unknown time  . isosorbide mononitrate (IMDUR) 30 MG 24 hr tablet Take 30 mg by mouth at bedtime.   05/20/2015 at Unknown time  . lisinopril (PRINIVIL,ZESTRIL) 5 MG  tablet Take 2.5 mg by mouth daily.   05/21/2015 at 1000  . metFORMIN (GLUCOPHAGE) 500 MG tablet Take 1,000 mg by mouth 2 (two) times daily with a meal.   05/21/2015 at Unknown time  . metoprolol tartrate (LOPRESSOR) 25 MG tablet Take 12.5-25 mg by mouth 2 (two) times daily. 25 mg in the morning and 12.5 mg at bedtime   05/21/2015 at 1000  . prasugrel (EFFIENT) 10 MG TABS tablet Take 10 mg by mouth daily.   05/21/2015 at Unknown time  . traZODone (DESYREL) 50 MG tablet Take 75 mg by mouth at bedtime.   05/20/2015 at Unknown time    Physical Exam: Blood pressure 100/47, pulse 65, temperature 97.7 F (36.5 C), temperature source Oral, resp. rate 16, height 5\' 5"  (1.651 m), weight 218 lb 9.6 oz  (99.156 kg), SpO2 92 %.   Constitutional: She is oriented to person, place, and time. She appears well-developed and well-nourished. No distress.  HENT: No nasal discharge.  Head: Normocephalic and atraumatic.  Eyes: Pupils are equal and round. No discharge.  Neck: Normal range of motion. Neck supple. No JVD present. No thyromegaly present.  Cardiovascular: Normal rate, regular rhythm, normal heart sounds. Exam reveals no gallop and no friction rub. No murmur heard.  Pulmonary/Chest: Effort normal and breath sounds normal. No stridor. No respiratory distress. She has no wheezes. She has no rales. She exhibits no tenderness.  Abdominal: Soft. Bowel sounds are normal. She exhibits no distension. There is no tenderness. There is no rebound and no guarding.  Musculoskeletal: Normal range of motion. She exhibits no edema and no tenderness.  Neurological: She is alert and oriented to person, place, and time. Coordination normal.  Skin: Skin is warm and dry. No rash noted. She is not diaphoretic. No erythema. No pallor.  Psychiatric: She has a normal mood and affect. Her behavior is normal. Judgment and thought content normal.     Labs:   Lab Results  Component Value Date   WBC 12.4* 05/21/2015   HGB 12.8 05/21/2015   HCT 39.1 05/21/2015   MCV 86.9 05/21/2015   PLT 291 05/21/2015    Recent Labs Lab 05/21/15 2220  NA 138  K 4.2  CL 102  CO2 29  BUN 20  CREATININE 1.52*  CALCIUM 9.3  PROT 7.2  BILITOT 0.3  ALKPHOS 62  ALT 25  AST 23  GLUCOSE 100*   Lab Results  Component Value Date   TROPONINI <0.03 05/22/2015       EKG:  NSR with low voltage   ASSESSMENT AND PLAN:   1. Atypical chest pain: The chest pain is overall atypical in quality which happened at rest and did not respond to nitroglycerin. There was no EKG changes and troponin is negative. The patient is known to have coronary artery disease as outlined above. She had a stress test done in September 2015 which was  normal. I suggested proceeding with a repeat stress test. However, the patient prefers to follow-up with her outpatient cardiologist which I think is reasonable given that basic workup so far has been negative. Can ambulate the patient today and if she has no recurrent symptoms, she can be discharged home from a cardiac standpoint. She has a follow-up appointment with her cardiologist next week.  2. Coronary artery disease: Patient is on optimal medical therapy.  Signed: Kathlyn Sacramento MD, [facility] 05/22/2015, 9:28 AM

## 2015-05-22 NOTE — H&P (Signed)
[name] is an 54 y.o. female.   Chief Complaint: Chest pain HPI: The patient presents to the emergency department complaining of severe chest pain unrelieved by 2 nitroglycerin tablets. The patient's pain began at rest. It was substernal and radiated through to her back and up into her left neck. She denies any nausea, vomiting or shortness of breath associated with her pain. In the emergency department the patient received aspirin which relieved her pain some. She required morphine as well to reduce her pain. At this time she is pain free. Total duration of pain more than 20 minutes. Patient has a past history of coronary artery disease status post PCI and angioplasty 2. Due to her recurrent symptoms emergency department staff called for admission.  Past Medical History  Diagnosis Date  . Coronary artery disease   . Hypertension   . Diabetes mellitus type 2 in obese ([place])   . OSA (obstructive sleep apnea)   . Hyperlipidemia   . Depression     Past Surgical History  Procedure Laterality Date  . Percutaneous coronary stent intervention (pci-s)      x2    History reviewed. No pertinent family history. Social History:  reports that she has never smoked. She does not have any smokeless tobacco history on file. Her alcohol and drug histories are not on file.  Allergies: No Known Allergies  Prior to Admission medications   Medication Sig Start Date End Date Taking? Authorizing Provider  atorvastatin (LIPITOR) 80 MG tablet Take 80 mg by mouth at bedtime.   Yes Historical Provider, MD  buPROPion (WELLBUTRIN) 75 MG tablet Take 75 mg by mouth 2 (two) times daily.   Yes Historical Provider, MD  citalopram (CELEXA) 40 MG tablet Take 1 tablet by mouth daily. 05/03/15  Yes Historical Provider, MD  furosemide (LASIX) 20 MG tablet Take 1 tablet by mouth daily. 04/10/15  Yes Historical Provider, MD  gabapentin (NEURONTIN) 300 MG capsule Take 3 capsules by mouth at bedtime. 04/15/15  Yes Historical  Provider, MD  glipiZIDE (GLUCOTROL) 10 MG tablet Take 1 tablet by mouth 2 (two) times daily. 04/30/15  Yes Historical Provider, MD  hydrOXYzine (ATARAX/VISTARIL) 25 MG tablet Take 25 mg by mouth every 4 (four) hours as needed for anxiety.   Yes Historical Provider, MD  insulin NPH Human (HUMULIN N,NOVOLIN N) 100 UNIT/ML injection Inject 34-38 Units into the skin 2 (two) times daily before a meal. 38 units in the morning and 34 units in the evening   Yes Historical Provider, MD  isosorbide mononitrate (IMDUR) 30 MG 24 hr tablet Take 30 mg by mouth at bedtime.   Yes Historical Provider, MD  lisinopril (PRINIVIL,ZESTRIL) 5 MG tablet Take 2.5 mg by mouth daily.   Yes Historical Provider, MD  metFORMIN (GLUCOPHAGE) 500 MG tablet Take 1,000 mg by mouth 2 (two) times daily with a meal.   Yes Historical Provider, MD  metoprolol tartrate (LOPRESSOR) 25 MG tablet Take 12.5-25 mg by mouth 2 (two) times daily. 25 mg in the morning and 12.5 mg at bedtime   Yes Historical Provider, MD  prasugrel (EFFIENT) 10 MG TABS tablet Take 10 mg by mouth daily.   Yes Historical Provider, MD  traZODone (DESYREL) 50 MG tablet Take 75 mg by mouth at bedtime.   Yes Historical Provider, MD     Results for orders placed or performed during the hospital encounter of 05/21/15 (from the past 48 hour(s))  CBC     Status: Abnormal   Collection Time:  05/21/15 10:20 PM  Result Value Ref Range   WBC 12.4 (H) 3.6 - 11.0 K/uL   RBC 4.50 3.80 - 5.20 MIL/uL   Hemoglobin 12.8 12.0 - 16.0 g/dL   HCT 39.1 35.0 - 47.0 %   MCV 86.9 80.0 - 100.0 fL   MCH 28.6 26.0 - 34.0 pg   MCHC 32.9 32.0 - 36.0 g/dL   RDW 12.9 11.5 - 14.5 %   Platelets 291 150 - 440 K/uL  Troponin I     Status: None   Collection Time: 05/21/15 10:20 PM  Result Value Ref Range   Troponin I <0.03 <0.031 ng/mL    Comment:        NO INDICATION OF MYOCARDIAL INJURY.   Comprehensive metabolic panel     Status: Abnormal   Collection Time: 05/21/15 10:20 PM  Result  Value Ref Range   Sodium 138 135 - 145 mmol/L   Potassium 4.2 3.5 - 5.1 mmol/L   Chloride 102 101 - 111 mmol/L   CO2 29 22 - 32 mmol/L   Glucose, Bld 100 (H) 65 - 99 mg/dL   BUN 20 6 - 20 mg/dL   Creatinine, Ser 1.52 (H) 0.44 - 1.00 mg/dL   Calcium 9.3 8.9 - 10.3 mg/dL   Total Protein 7.2 6.5 - 8.1 g/dL   Albumin 3.7 3.5 - 5.0 g/dL   AST 23 15 - 41 U/L   ALT 25 14 - 54 U/L   Alkaline Phosphatase 62 38 - 126 U/L   Total Bilirubin 0.3 0.3 - 1.2 mg/dL   GFR calc non Af Amer 37 (L) >60 mL/min   GFR calc Af Amer 43 (L) >60 mL/min    Comment: (NOTE) The eGFR has been calculated using the CKD EPI equation. This calculation has not been validated in all clinical situations. eGFR's persistently <60 mL/min signify possible Chronic Kidney Disease.    Anion gap 7 5 - 15   Dg Chest 2 View  05/21/2015  CLINICAL DATA:  Acute onset of mid sternal and left-sided chest pain and pressure, radiating to the back. Nausea. Initial encounter. EXAM: CHEST  2 VIEW COMPARISON:  Chest radiograph performed 01/24/2013 FINDINGS: The lungs are well-aerated and clear. There is no evidence of focal opacification, pleural effusion or pneumothorax. The heart is normal in size; the mediastinal contour is within normal limits. No acute osseous abnormalities are seen. Clips are noted within the right upper quadrant, reflecting prior cholecystectomy. IMPRESSION: No acute cardiopulmonary process seen. Electronically Signed   By: Garald Balding M.D.   On: 05/21/2015 23:05    Review of Systems  Constitutional: Negative for fever and chills.  HENT: Negative for sore throat and tinnitus.   Eyes: Negative for blurred vision and redness.  Respiratory: Negative for cough and shortness of breath.   Cardiovascular: Positive for chest pain. Negative for palpitations, orthopnea and PND.  Gastrointestinal: Negative for nausea, vomiting, abdominal pain and diarrhea.  Genitourinary: Negative for dysuria, urgency and frequency.   Musculoskeletal: Negative for myalgias and joint pain.  Skin: Negative for rash.       No lesions  Neurological: Negative for speech change, focal weakness and weakness.  Endo/Heme/Allergies: Does not bruise/bleed easily.       No temperature intolerance  Psychiatric/Behavioral: Negative for depression and suicidal ideas.    Blood pressure 124/56, pulse 65, temperature 98.4 F (36.9 C), temperature source Oral, resp. rate 17, height 5' 5"  (1.651 m), weight 97.523 kg (215 lb), SpO2 96 %. Physical Exam  Vitals reviewed. Constitutional: She is oriented to person, place, and time. She appears well-developed and well-nourished. No distress.  HENT:  Head: Normocephalic and atraumatic.  Mouth/Throat: Oropharynx is clear and moist.  Eyes: Conjunctivae and EOM are normal. Pupils are equal, round, and reactive to light. No scleral icterus.  Neck: Normal range of motion. Neck supple. No JVD present. No tracheal deviation present. No thyromegaly present.  Cardiovascular: Normal rate, regular rhythm and normal heart sounds.  Exam reveals no gallop and no friction rub.   No murmur heard. Respiratory: Effort normal and breath sounds normal.  GI: Soft. Bowel sounds are normal. She exhibits no distension. There is no tenderness.  Genitourinary:  Deferred  Musculoskeletal: Normal range of motion. She exhibits no edema.  Lymphadenopathy:    She has no cervical adenopathy.  Neurological: She is alert and oriented to person, place, and time. No cranial nerve deficit. She exhibits normal muscle tone.  Skin: Skin is warm and dry. No rash noted. No erythema.  Psychiatric: She has a normal mood and affect. Her behavior is normal. Judgment and thought content normal.     Assessment/Plan This is a 54 year old Caucasian female admitted for chest pain. 1. Chest pain: Duration longer than 20 minutes and not associated with usual angina equivalents. However the patient has an extensive cardiac history which  makes her more high risk. Initial troponin is negative and there are no EKG changes suggestive of ischemia. We will continue to follow cardiac enzymes. Cardiology consult placed. Notably the patient has received the majority of her cardiac care in [facility]. 2. Coronary artery disease: Continue Imdur and Effient 3. Hypertension: Continue metoprolol and lisinopril 4. Diabetes mellitus type 2: Metformin and glipizide held; continue basal insulin. Sliding-scale insulin while hospitalized 5. Depression: Continue bupropion and Celexa 6. OSA: CPAP per home settings 7. Hyperlipidemia: Continue statin therapy 8. DVT prophylaxis: Heparin 9. GI prophylaxis: None The patient is a full code. Time spent on admission was inpatient care approximately 35 minutes   Harrie Foreman 05/22/2015, 12:10 AM

## 2015-05-22 NOTE — Progress Notes (Signed)
Pt stated she would try our CPAP unit tonight, but does not need at this time.

## 2015-05-22 NOTE — Discharge Summary (Signed)
[name], is a 54 y.o. female  DOB 1960/10/05  MRN [id number].  Admission date:  05/21/2015  Admitting Physician  Harrie Foreman, MD  Discharge Date:  05/22/2015   Primary MD  No primary care provider on file.  Recommendations for primary care physician for things to follow:   FOLLOW UP  with cardiology at [facility] next Monday or Tuesday.   Admission Diagnosis  Chest pain, unspecified chest pain type [R07.9]   Discharge Diagnosis  Chest pain, unspecified chest pain type [R07.9]    Active Problems:   Chest pain      Past Medical History  Diagnosis Date  . Coronary artery disease   . Hypertension   . Diabetes mellitus type 2 in obese ([place])   . OSA (obstructive sleep apnea)   . Hyperlipidemia   . Depression     Past Surgical History  Procedure Laterality Date  . Percutaneous coronary stent intervention (pci-s)      x2       History of present illness and  Hospital Course:     Kindly see H&P for history of present illness and admission details, please review complete Labs, Consult reports and Test reports for all details in brief  HPI  from the history and physical done on the day of admission 54 year old female patient with history of coronary artery disease and angioplasty times comes in with chest pain, admitted to telemetry for chest pain and possible unstable angina.   Hospital Course  1. #1 chest pain; patient troponins have been negative for 3 times. Seen by cardiology Dr.ARIDA he recommended stress test but patient chose to follow up with her cardiologist at [facility] next Week. Stentor to be atypical at this time. Advised her to continue her home medications including tar, Imdur, Metoprolol, Effient,Prinvil 2.DMII"comtinue on  Metformin and other home meds 3.Depression;continue   Celexa,Welbutrin.    Discharge Condition:stable   Follow UP  Follow-up Information    Follow up with cardiologist at [facility] In 3 days.   Why:  You will need to call your Cardiologist for an appointment, ccs        Discharge Instructions  and  Discharge Medications        Medication List    TAKE these medications        atorvastatin 80 MG tablet  Commonly known as:  LIPITOR  Take 80 mg by mouth at bedtime.     buPROPion 75 MG tablet  Commonly known as:  WELLBUTRIN  Take 75 mg by mouth 2 (two) times daily.     citalopram 40 MG tablet  Commonly known as:  CELEXA  Take 1 tablet by mouth daily.     furosemide 20 MG tablet  Commonly known as:  LASIX  Take 1 tablet by mouth daily.     gabapentin 300 MG capsule  Commonly known as:  NEURONTIN  Take 3 capsules by mouth at bedtime.     glipiZIDE 10 MG tablet  Commonly known as:  GLUCOTROL  Take 1 tablet by mouth 2 (two) times daily.     hydrOXYzine 25 MG tablet  Commonly known as:  ATARAX/VISTARIL  Take 25 mg by mouth every 4 (four) hours as needed for anxiety.     insulin NPH Human 100 UNIT/ML injection  Commonly known as:  HUMULIN N,NOVOLIN N  Inject 34-38 Units into the skin 2 (two) times daily before a meal. 38 units in the morning and 34 units in the evening  isosorbide mononitrate 30 MG 24 hr tablet  Commonly known as:  IMDUR  Take 30 mg by mouth at bedtime.     lisinopril 5 MG tablet  Commonly known as:  PRINIVIL,ZESTRIL  Take 2.5 mg by mouth daily.     metFORMIN 500 MG tablet  Commonly known as:  GLUCOPHAGE  Take 1,000 mg by mouth 2 (two) times daily with a meal.     metoprolol tartrate 25 MG tablet  Commonly known as:  LOPRESSOR  Take 12.5-25 mg by mouth 2 (two) times daily. 25 mg in the morning and 12.5 mg at bedtime     prasugrel 10 MG Tabs tablet  Commonly known as:  EFFIENT  Take 10 mg by mouth daily.     traZODone 50 MG tablet  Commonly known as:  DESYREL  Take 75 mg by mouth at  bedtime.          Diet and Activity recommendation: See Discharge Instructions above   Consults obtained - CArdio   Major procedures and Radiology Reports - PLEASE review detailed and final reports for all details, in brief -      Dg Chest 2 View  05/21/2015  CLINICAL DATA:  Acute onset of mid sternal and left-sided chest pain and pressure, radiating to the back. Nausea. Initial encounter. EXAM: CHEST  2 VIEW COMPARISON:  Chest radiograph performed 01/24/2013 FINDINGS: The lungs are well-aerated and clear. There is no evidence of focal opacification, pleural effusion or pneumothorax. The heart is normal in size; the mediastinal contour is within normal limits. No acute osseous abnormalities are seen. Clips are noted within the right upper quadrant, reflecting prior cholecystectomy. IMPRESSION: No acute cardiopulmonary process seen. Electronically Signed   By: Garald Balding M.D.   On: 05/21/2015 23:05    Micro Results     No results found for this or any previous visit (from the past 240 hour(s)).     Today   Subjective:   [name] today has no headache,no chest abdominal pain,no new weakness tingling or numbness, feels much better wants to go home today.   Objective:   Blood pressure 119/50, pulse 59, temperature 97.8 F (36.6 C), temperature source Oral, resp. rate 18, height 5\' 5"  (1.651 m), weight 99.156 kg (218 lb 9.6 oz), SpO2 96 %.   Intake/Output Summary (Last 24 hours) at 05/22/15 1315 Last data filed at 05/22/15 0740  Gross per 24 hour  Intake 577.08 ml  Output    800 ml  Net -222.92 ml    Exam Awake Alert, Oriented x 3, No new F.N deficits, Normal affect [place].AT,PERRAL Supple Neck,No JVD, No cervical lymphadenopathy appriciated.  Symmetrical Chest wall movement, Good air movement bilaterally, CTAB RRR,No Gallops,Rubs or new Murmurs, No Parasternal Heave +ve B.Sounds, Abd Soft, Non tender, No organomegaly appriciated, No rebound -guarding or  rigidity. No Cyanosis, Clubbing or edema, No new Rash or bruise  Data Review   CBC w Diff: Lab Results  Component Value Date   WBC 12.4* 05/21/2015   WBC 18.2* 01/25/2013   HGB 12.8 05/21/2015   HGB 11.5* 01/25/2013   HCT 39.1 05/21/2015   HCT 34.0* 01/25/2013   PLT 291 05/21/2015   PLT 223 01/25/2013   LYMPHOPCT 15.5 01/25/2013   MONOPCT 11.9 01/25/2013   EOSPCT 0.7 01/25/2013   BASOPCT 0.3 01/25/2013    CMP: Lab Results  Component Value Date   NA 138 05/21/2015   NA 139 01/25/2013   K 4.2 05/21/2015   K 3.9  01/25/2013   CL 102 05/21/2015   CL 104 01/25/2013   CO2 29 05/21/2015   CO2 31 01/25/2013   BUN 20 05/21/2015   BUN 13 01/25/2013   CREATININE 1.52* 05/21/2015   CREATININE 1.27 01/25/2013   PROT 7.2 05/21/2015   PROT 7.8 01/24/2013   ALBUMIN 3.7 05/21/2015   ALBUMIN 3.4 01/24/2013   BILITOT 0.3 05/21/2015   BILITOT 0.8 01/24/2013   ALKPHOS 62 05/21/2015   ALKPHOS 83 01/24/2013   AST 23 05/21/2015   AST 13* 01/24/2013   ALT 25 05/21/2015   ALT 26 01/24/2013  .   Total Time in preparing paper work, data evaluation and todays exam - 36 minutes  Shareece Bultman M.D on 05/22/2015 at 1:15 PM    Note: This dictation was prepared with Dragon dictation along with smaller phrase technology. Any transcriptional errors that result from this process are unintentional.

## 2015-05-22 NOTE — Progress Notes (Signed)
Pts SBP is < 100. MD notified verbally. Orders given and read back. Orders to hold metoprolol and lasix and give lisinopril as ordered. Will continue to assess.

## 2015-06-12 ENCOUNTER — Other Ambulatory Visit: Payer: Self-pay | Admitting: Student in an Organized Health Care Education/Training Program

## 2015-07-25 ENCOUNTER — Other Ambulatory Visit: Payer: Self-pay | Admitting: Student in an Organized Health Care Education/Training Program

## 2015-09-09 ENCOUNTER — Other Ambulatory Visit: Payer: Self-pay | Admitting: Student in an Organized Health Care Education/Training Program

## 2015-10-21 ENCOUNTER — Other Ambulatory Visit: Payer: Self-pay | Admitting: Student in an Organized Health Care Education/Training Program

## 2015-12-30 ENCOUNTER — Other Ambulatory Visit: Payer: Self-pay | Admitting: Student in an Organized Health Care Education/Training Program

## 2016-02-10 DIAGNOSIS — Z0289 Encounter for other administrative examinations: Secondary | ICD-10-CM | POA: Insufficient documentation

## 2016-09-21 DIAGNOSIS — M542 Cervicalgia: Secondary | ICD-10-CM | POA: Insufficient documentation

## 2016-10-15 IMAGING — CR DG CHEST 2V
1 series · 2 of 2 positions shown · non-contrast
Comparison: Chest radiograph performed 01/24/2013

CLINICAL DATA: Acute onset of mid sternal and left-sided chest pain
and pressure, radiating to the back. Nausea. Initial encounter.

EXAM:
CHEST  2 VIEW

[Series 1: dg chest 2 view · 0.14mm/px · 2 of 2 slices shown]
[im 1/2]
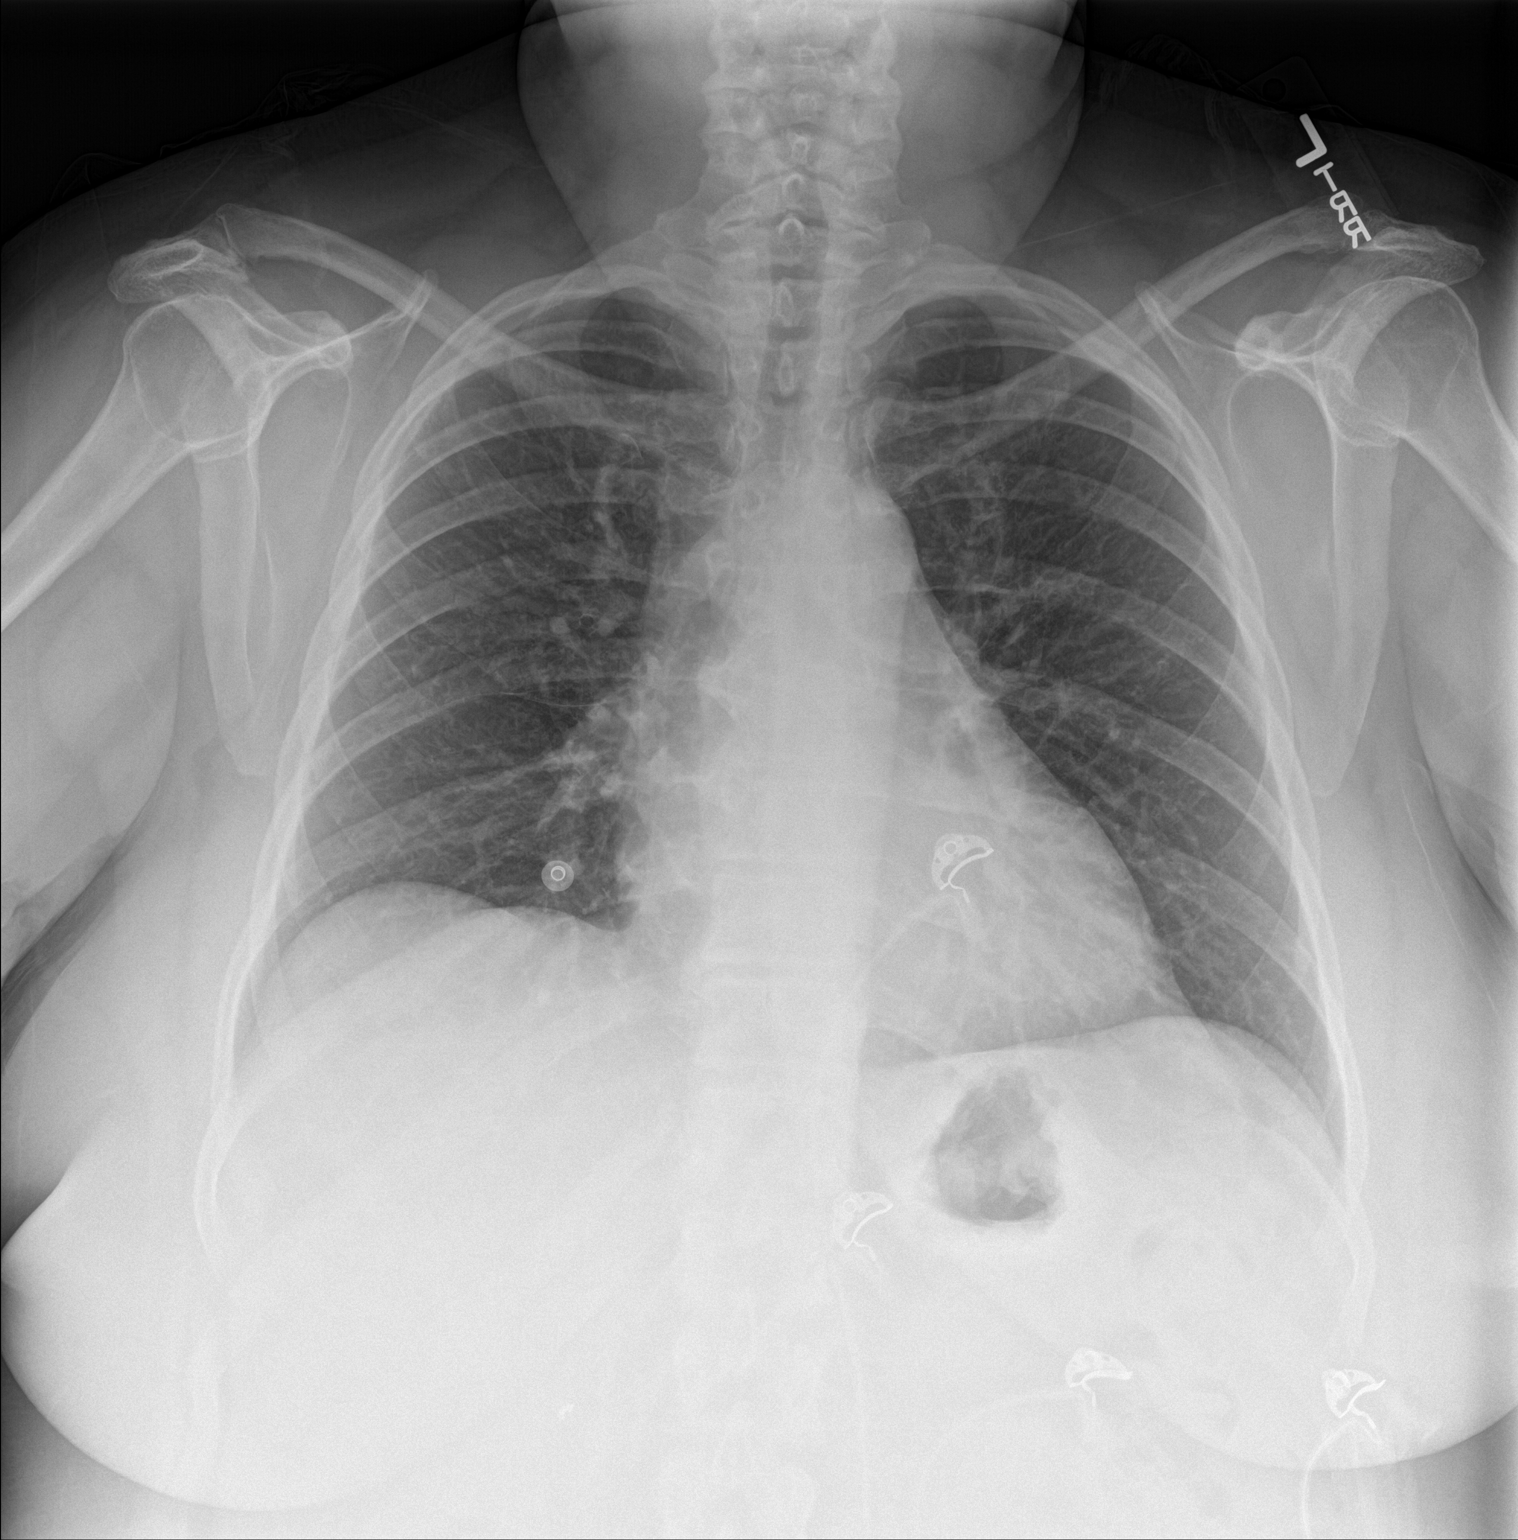
[im 2/2]
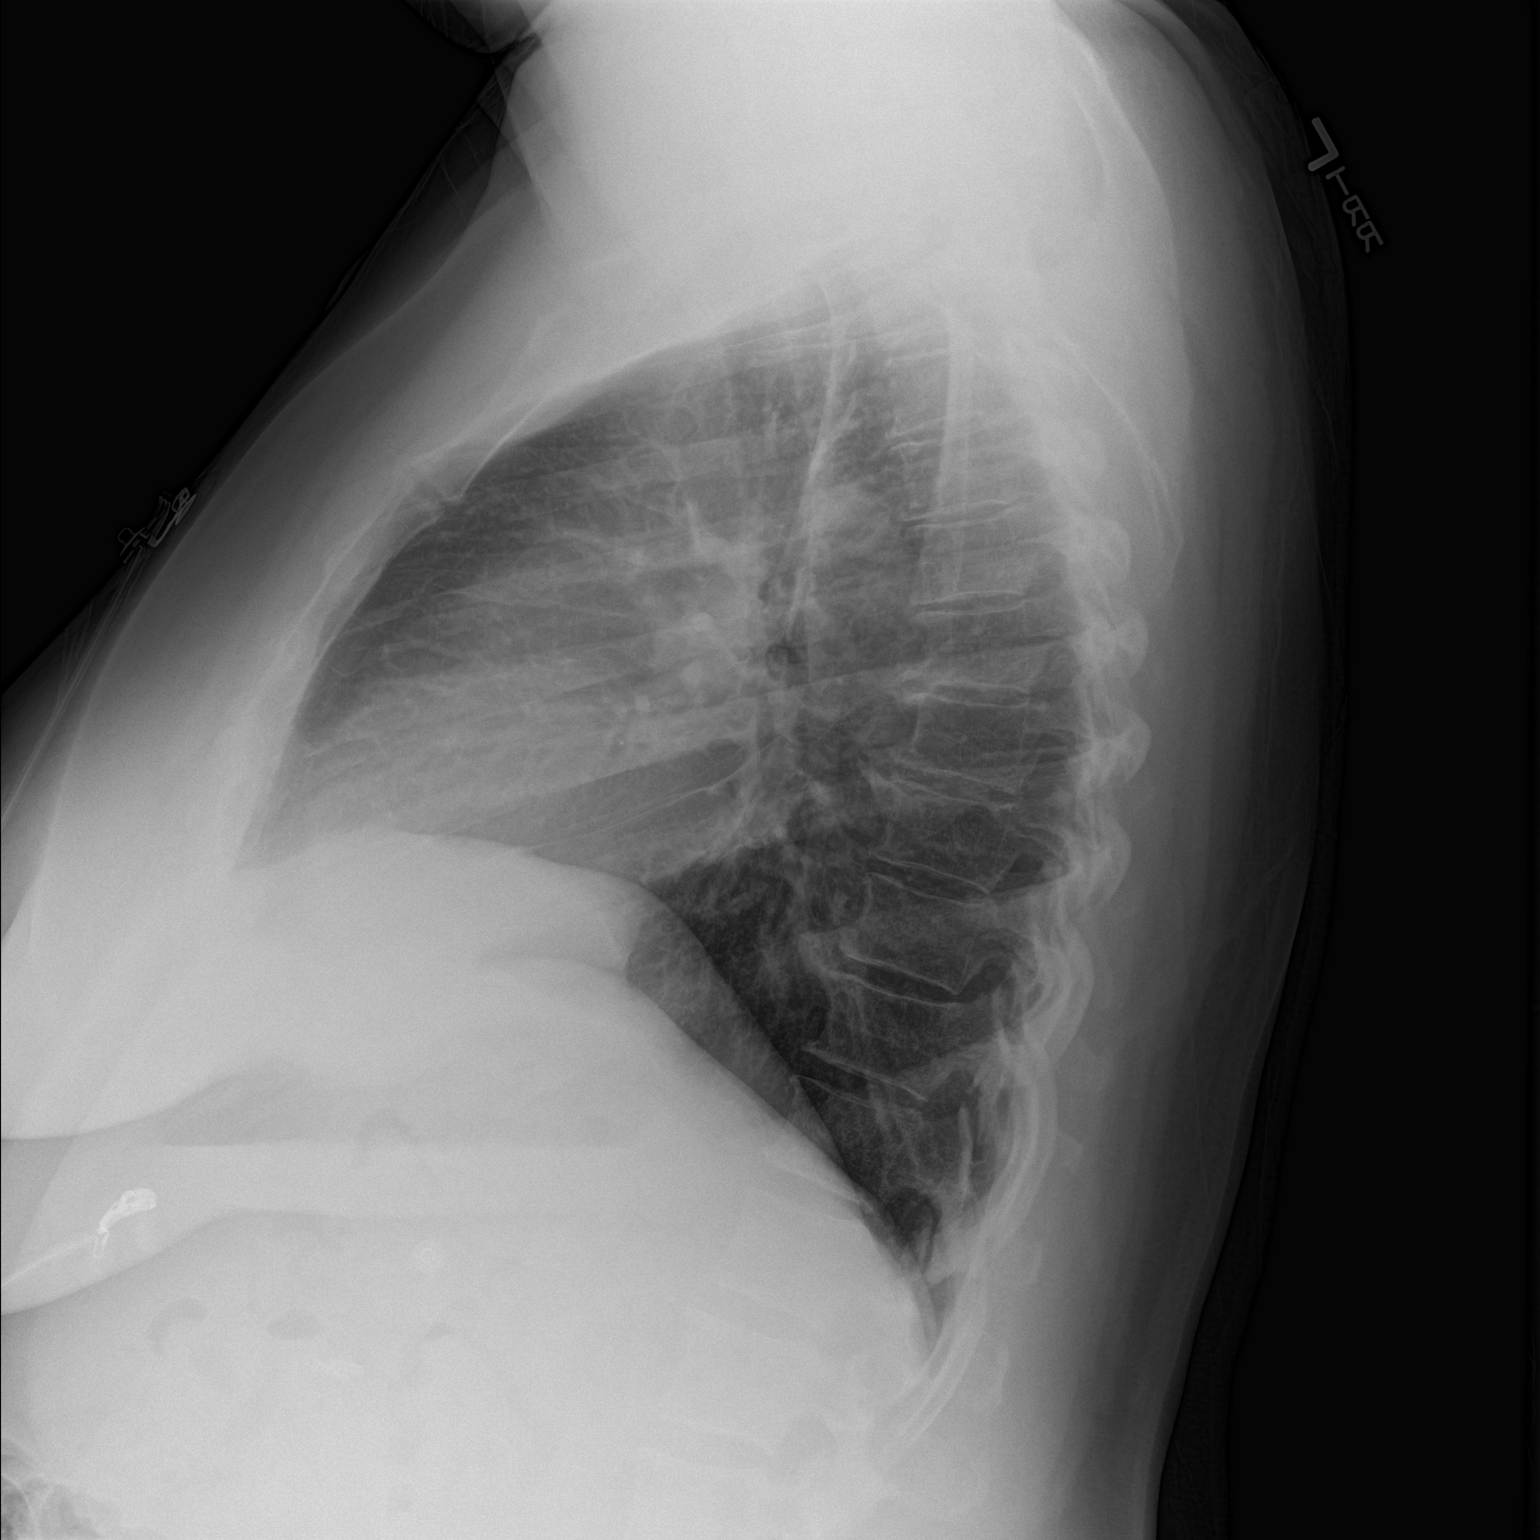

[2 of 2 positions shown; findings below may reference images not displayed]

FINDINGS: The lungs are well-aerated and clear. There is no evidence of focal
opacification, pleural effusion or pneumothorax.

The heart is normal in size; the mediastinal contour is within
normal limits. No acute osseous abnormalities are seen. Clips are
noted within the right upper quadrant, reflecting prior
cholecystectomy.
IMPRESSION: No acute cardiopulmonary process seen.

## 2020-01-21 DIAGNOSIS — H50112 Monocular exotropia, left eye: Secondary | ICD-10-CM | POA: Insufficient documentation

## 2020-03-12 DIAGNOSIS — Z9889 Other specified postprocedural states: Secondary | ICD-10-CM | POA: Insufficient documentation

## 2020-08-15 ENCOUNTER — Other Ambulatory Visit: Payer: Medicare HMO

## 2020-08-15 DIAGNOSIS — Z20822 Contact with and (suspected) exposure to covid-19: Secondary | ICD-10-CM

## 2020-08-19 LAB — NOVEL CORONAVIRUS, NAA: SARS-CoV-2, NAA: NOT DETECTED

## 2020-09-15 DIAGNOSIS — N2889 Other specified disorders of kidney and ureter: Secondary | ICD-10-CM | POA: Insufficient documentation

## 2021-03-10 ENCOUNTER — Ambulatory Visit: Payer: Medicare HMO | Admitting: Podiatry

## 2021-03-31 ENCOUNTER — Encounter: Payer: Self-pay | Admitting: Podiatry

## 2021-03-31 ENCOUNTER — Ambulatory Visit (INDEPENDENT_AMBULATORY_CARE_PROVIDER_SITE_OTHER): Payer: Medicare HMO

## 2021-03-31 ENCOUNTER — Ambulatory Visit: Payer: Medicare HMO | Admitting: Podiatry

## 2021-03-31 ENCOUNTER — Other Ambulatory Visit: Payer: Self-pay

## 2021-03-31 DIAGNOSIS — D2371 Other benign neoplasm of skin of right lower limb, including hip: Secondary | ICD-10-CM

## 2021-03-31 DIAGNOSIS — B351 Tinea unguium: Secondary | ICD-10-CM | POA: Diagnosis not present

## 2021-03-31 DIAGNOSIS — M778 Other enthesopathies, not elsewhere classified: Secondary | ICD-10-CM

## 2021-03-31 DIAGNOSIS — M79676 Pain in unspecified toe(s): Secondary | ICD-10-CM | POA: Diagnosis not present

## 2021-03-31 DIAGNOSIS — D2372 Other benign neoplasm of skin of left lower limb, including hip: Secondary | ICD-10-CM | POA: Diagnosis not present

## 2021-03-31 NOTE — Progress Notes (Signed)
Subjective:  Patient ID: [name], female    DOB: 1961-05-06,  MRN: [id number] HPI Chief Complaint  Patient presents with   Foot Pain    5th MPJ bilateral (L>R) - callused areas x years, gotten sore over the last 6 months   Toe Pain    2nd toes bilateral - hammertoe deformities-PCP recommended eval   Nail Problem    5th toenails bilateral - thick and dark   New Patient (Initial Visit)   Diabetes    Last a1c was 7.3    60 y.o. female presents with the above complaint.   ROS: Denies fever chills nausea vomiting muscle aches pains calf pain back pain chest pain shortness of breath.  Past Medical History:  Diagnosis Date   Coronary artery disease    Depression    Diabetes mellitus type 2 in obese ([facility])    Hyperlipidemia    Hypertension    OSA (obstructive sleep apnea)    Past Surgical History:  Procedure Laterality Date   PERCUTANEOUS CORONARY STENT INTERVENTION (PCI-S)     x2    Current Outpatient Medications:    atorvastatin (LIPITOR) 80 MG tablet, Take 1 tablet by mouth daily., Disp: , Rfl:    HYDROcodone-acetaminophen (NORCO/VICODIN) 5-325 MG tablet, Take 1 tablet by mouth every 6 (six) hours as needed for moderate pain., Disp: , Rfl:    tizanidine (ZANAFLEX) 2 MG capsule, Take 2 mg by mouth 3 (three) times daily., Disp: , Rfl:    buPROPion (WELLBUTRIN) 75 MG tablet, Take 75 mg by mouth 2 (two) times daily., Disp: , Rfl:    citalopram (CELEXA) 40 MG tablet, Take 1 tablet by mouth daily., Disp: , Rfl:    furosemide (LASIX) 20 MG tablet, Take 1 tablet by mouth daily., Disp: , Rfl:    gabapentin (NEURONTIN) 300 MG capsule, Take 3 capsules by mouth at bedtime., Disp: , Rfl:    hydrOXYzine (ATARAX/VISTARIL) 25 MG tablet, Take 25 mg by mouth every 4 (four) hours as needed for anxiety., Disp: , Rfl:    insulin NPH Human (HUMULIN N,NOVOLIN N) 100 UNIT/ML injection, Inject 34-38 Units into the skin 2 (two) times daily before a meal. 38 units in the morning and 34 units in the  evening, Disp: , Rfl:    isosorbide mononitrate (IMDUR) 30 MG 24 hr tablet, Take 30 mg by mouth at bedtime., Disp: , Rfl:    metFORMIN (GLUCOPHAGE) 500 MG tablet, Take 1,000 mg by mouth 2 (two) times daily with a meal., Disp: , Rfl:    metoprolol tartrate (LOPRESSOR) 25 MG tablet, Take 12.5-25 mg by mouth 2 (two) times daily. 25 mg in the morning and 12.5 mg at bedtime, Disp: , Rfl:    traZODone (DESYREL) 50 MG tablet, Take 75 mg by mouth at bedtime., Disp: , Rfl:   Allergies  Allergen Reactions   Gadolinium Derivatives Hives    Had itching and hives following IV contrast on 09/01/2020. Had lip and face swelling the following day.   Review of Systems Objective:  There were no vitals filed for this visit.  General: Well developed, nourished, in no acute distress, alert and oriented x3   Dermatological: Skin is warm, dry and supple bilateral. Nails x 10 are well maintained; remaining integument appears unremarkable at this time. There are no open sores, no preulcerative lesions, no rash or signs of infection present.  Painful calluses and toenails bilateral foot  Vascular: Dorsalis Pedis artery and Posterior Tibial artery pedal pulses are 2/4 bilateral with  immedate capillary fill time. Pedal hair growth present. No varicosities and no lower extremity edema present bilateral.   Neruologic: Grossly intact via light touch bilateral. Vibratory intact via tuning fork bilateral. Protective threshold with Semmes Wienstein monofilament intact to all pedal sites bilateral. Patellar and Achilles deep tendon reflexes 2+ bilateral. No Babinski or clonus noted bilateral.   Musculoskeletal: No gross boney pedal deformities bilateral. No pain, crepitus, or limitation noted with foot and ankle range of motion bilateral. Muscular strength 5/5 in all groups tested bilateral.  Painful bursa beneath the fifth metatarsal head of the left foot.  Gait: Unassisted, Nonantalgic.    Radiographs:  Radiographs taken  today demonstrate osseously mature individual no acute findings.  History of refracture fifth met base right  Assessment & Plan:   Assessment: Diabetic peripheral neuropathy.  Painful bursitis and painful benign skin lesions.  Painful elongated toenails.  Plan: Debridement of toenails 1 through 5 bilateral.  Debridement of benign skin lesions.  I injected the bursa left foot today beneath the fifth metatarsal head with 2 mg of dexamethasone after local anesthetic was administered she tolerated the procedure well.  I will follow-up with her in 3 months     [name] T. [place], [place]

## 2021-07-01 ENCOUNTER — Ambulatory Visit: Payer: Medicare HMO | Admitting: Podiatry

## 2021-08-05 ENCOUNTER — Ambulatory Visit: Payer: Medicare HMO | Admitting: Podiatry

## 2021-10-13 ENCOUNTER — Ambulatory Visit (INDEPENDENT_AMBULATORY_CARE_PROVIDER_SITE_OTHER): Payer: Medicare HMO

## 2021-10-13 ENCOUNTER — Other Ambulatory Visit: Payer: Self-pay

## 2021-10-13 ENCOUNTER — Ambulatory Visit: Payer: Medicare HMO | Admitting: Podiatry

## 2021-10-13 DIAGNOSIS — D2372 Other benign neoplasm of skin of left lower limb, including hip: Secondary | ICD-10-CM | POA: Diagnosis not present

## 2021-10-13 DIAGNOSIS — S9032XA Contusion of left foot, initial encounter: Secondary | ICD-10-CM | POA: Diagnosis not present

## 2021-10-13 DIAGNOSIS — M7752 Other enthesopathy of left foot: Secondary | ICD-10-CM

## 2021-10-13 DIAGNOSIS — M7661 Achilles tendinitis, right leg: Secondary | ICD-10-CM

## 2021-10-13 MED ORDER — MELOXICAM 15 MG PO TABS: 15.0000 mg | ORAL_TABLET | Freq: Every day | ORAL | 3 refills | Status: AC

## 2021-10-13 MED ORDER — DEXAMETHASONE SODIUM PHOSPHATE 120 MG/30ML IJ SOLN
4.0000 mg | Freq: Once | INTRAMUSCULAR | Status: AC
  Administered 2021-10-13: 4 mg via INTRA_ARTICULAR

## 2021-10-13 MED ORDER — METHYLPREDNISOLONE 4 MG PO TBPK: ORAL_TABLET | ORAL | 0 refills | Status: DC

## 2021-10-13 NOTE — Progress Notes (Signed)
[name] presents today for follow-up of her pain to her right foot.  States that after her injection some fifth metatarsal it got much better.  She is complaining today of pain of unknown etiology to her posterior right heel at the Achilles insertion site as she points to the area.  She is also complaining of pain subfifth met left.  States that she also dropped a 3 pound frozen hotdogs on her left foot.  She denies any problems with that however. ? ?Objective: I reviewed her past medical history medications allergies surgeries and social history.  Pulses are palpable bilateral.  No pain to the dorsal aspect of the left foot.  No abrasions.  She does have benign reactive hyper keratomas plantar aspect of the second and fifth metatarsals of the left foot with a palpable bursitis beneath the fifth metatarsal head of the left foot.  She also has pain on the right foot consistent with insertional Achilles tendinitis. ? ?Radiographs taken today demonstrate an osseously mature individual some posterior spurring and thickening of the Achilles in the right heel.  Contralateral foot does not demonstrate any significant abnormalities other than some osteoarthritic changes midfoot bilateral. ? ?Assessment: Benign skin lesions left foot.  Bursitis of fifth left foot.  Right foot demonstrates insertional Achilles tendinitis. ? ?Plan: Discussed etiology pathology conservative versus surgical therapies.  At this point I injected 2 mg Sub cutaneous overlying the Achilles making sure not to inject into the Achilles themselves right heel.  I also injected into the bursa 2 mg of dexamethasone and I was able to debride all of her reactive hyperkeratoses no open lesions or wounds are noted.  Also starting her Medrol Dosepak to be followed by meloxicam and placing her in a cam boot to the right lower extremity.  Follow-up with her in about 3 to 4 weeks. ? ?

## 2021-10-14 ENCOUNTER — Telehealth: Payer: Self-pay | Admitting: *Deleted

## 2021-10-14 NOTE — Telephone Encounter (Signed)
Patient is calling and wanted to switch out her boot, too large, causing too much pressure on her hip. Please advise. ?

## 2021-10-14 NOTE — Telephone Encounter (Signed)
The boot is heavy and will cause a little hip pain due to sometimes just the uneven gait and heaviness of the boot, but if she wants to try a smaller size she can.  ?

## 2021-10-15 NOTE — Telephone Encounter (Signed)
Notified patient thru vmessage,no answer.

## 2021-10-21 NOTE — Telephone Encounter (Signed)
"  I received a boot that is too big.  I called and told you all Thursday of last week and nobody has called me back.  If this is how you treat your patients, I will go somewhere else."  I apologize.  I see where Ammie tried to call you on 03/16 and left you a voicemail message.  "Well, I didn't get it. When I got the boot, I was told that they didn't have my size so, they would go up a size.  Do you have all the sizes now?"  Yes, we do.  What size do you have now?  "It's a large."  Is it a short or tall boot?  "He wanted me to have a shore one."  Bring [place] the boot that you have and we can switch it out for a medium.  Again, I apologize.   ?

## 2021-11-17 ENCOUNTER — Ambulatory Visit: Payer: Medicare HMO | Admitting: Podiatry

## 2021-11-17 ENCOUNTER — Encounter: Payer: Self-pay | Admitting: Podiatry

## 2021-11-17 DIAGNOSIS — D2372 Other benign neoplasm of skin of left lower limb, including hip: Secondary | ICD-10-CM

## 2021-11-17 DIAGNOSIS — M7752 Other enthesopathy of left foot: Secondary | ICD-10-CM | POA: Diagnosis not present

## 2021-11-17 DIAGNOSIS — S86011A Strain of right Achilles tendon, initial encounter: Secondary | ICD-10-CM | POA: Diagnosis not present

## 2021-11-17 MED ORDER — DEXAMETHASONE SODIUM PHOSPHATE 120 MG/30ML IJ SOLN
2.0000 mg | Freq: Once | INTRAMUSCULAR | Status: AC
  Administered 2021-11-17: 2 mg via INTRA_ARTICULAR

## 2021-11-17 NOTE — Progress Notes (Signed)
She presents today for follow-up of her Achilles tendinitis of her right foot.  States that is hurting me and the boot is hurting my hip. ? ?Objective: Vital signs are stable alert and oriented x3.  She still has tenderness on palpation of her Achilles right heel.  She also has pain on palpation some fifth metatarsal head of the left foot.  With an overlying benign skin lesion. ? ?Assessment: Chronic Achilles tendinitis.  Bursitis of fifth metatarsal head left foot. ? ?Plan: Due to failure of conservative therapies to alleviate this patient's symptomatology were going to request an MRI of her right ankle for evaluation of the Achilles.  This is for differential diagnosis and surgical consideration.  Also I injected the bursa today beneath the fifth metatarsal head of the left foot and debrided benign skin lesion.  Follow-up with her once the MRI comes back. ?

## 2021-11-26 ENCOUNTER — Other Ambulatory Visit: Payer: Medicare HMO

## 2021-12-03 ENCOUNTER — Ambulatory Visit
Admission: RE | Admit: 2021-12-03 | Discharge: 2021-12-03 | Disposition: A | Discharge status: Home / Self Care | Payer: Medicare HMO | Source: Ambulatory Visit | Attending: Podiatry | Admitting: Podiatry

## 2021-12-03 DIAGNOSIS — S86011A Strain of right Achilles tendon, initial encounter: Secondary | ICD-10-CM

## 2021-12-16 ENCOUNTER — Telehealth: Payer: Self-pay | Admitting: *Deleted

## 2021-12-16 NOTE — Telephone Encounter (Signed)
-----   Message from Garrel Ridgel, [place] sent at 12/09/2021  8:04 AM EDT ----- Please send for over read and inform patient of the delay

## 2021-12-16 NOTE — Telephone Encounter (Signed)
Faxed request to [facility] to send patient's MRI disc to [facility].  Faxed order to [facility] for what to review for 2nd opinion.   

## 2022-07-04 ENCOUNTER — Encounter: Payer: Self-pay | Admitting: Podiatry

## 2022-07-04 ENCOUNTER — Ambulatory Visit: Payer: Medicare HMO | Admitting: Podiatry

## 2022-07-04 DIAGNOSIS — M778 Other enthesopathies, not elsewhere classified: Secondary | ICD-10-CM

## 2022-07-04 DIAGNOSIS — D2372 Other benign neoplasm of skin of left lower limb, including hip: Secondary | ICD-10-CM | POA: Diagnosis not present

## 2022-07-04 DIAGNOSIS — D2371 Other benign neoplasm of skin of right lower limb, including hip: Secondary | ICD-10-CM

## 2022-07-04 DIAGNOSIS — M7661 Achilles tendinitis, right leg: Secondary | ICD-10-CM | POA: Diagnosis not present

## 2022-07-04 NOTE — Progress Notes (Signed)
She presents today for follow-up of her Achilles pain right states that it started burning again and wanted to know what the MRI had said.  Objective: Vital signs are stable alert oriented x 3.  She still has some tenderness on palpation to the posterior aspect of her Achilles tendon right.  Some Planter fasciitis and lateral compensation is also noted.  MRI does relate fraying of the Achilles tendon.  Assessment Achilles tendinitis.  Plan: Recommended physical therapy follow-up with me once that is done.

## 2022-08-26 ENCOUNTER — Encounter: Payer: Self-pay | Admitting: Podiatry
